# Patient Record
Sex: Female | Born: 1940 | Race: White | Hispanic: No | Smoking: Former smoker
Health system: Southern US, Community
[De-identification: ages and names within clinical notes are randomized; demographics above are authoritative.]

## PROBLEM LIST (undated history)

## (undated) DIAGNOSIS — I5043 Acute on chronic combined systolic (congestive) and diastolic (congestive) heart failure: Principal | ICD-10-CM

## (undated) DIAGNOSIS — E039 Hypothyroidism, unspecified: Secondary | ICD-10-CM

## (undated) DIAGNOSIS — C801 Malignant (primary) neoplasm, unspecified: Secondary | ICD-10-CM

## (undated) DIAGNOSIS — D649 Anemia, unspecified: Secondary | ICD-10-CM

## (undated) DIAGNOSIS — R918 Other nonspecific abnormal finding of lung field: Secondary | ICD-10-CM

## (undated) DIAGNOSIS — J449 Chronic obstructive pulmonary disease, unspecified: Secondary | ICD-10-CM

## (undated) DIAGNOSIS — E785 Hyperlipidemia, unspecified: Secondary | ICD-10-CM

## (undated) DIAGNOSIS — M810 Age-related osteoporosis without current pathological fracture: Secondary | ICD-10-CM

## (undated) DIAGNOSIS — I10 Essential (primary) hypertension: Secondary | ICD-10-CM

## (undated) DIAGNOSIS — F329 Major depressive disorder, single episode, unspecified: Secondary | ICD-10-CM

## (undated) DIAGNOSIS — F419 Anxiety disorder, unspecified: Secondary | ICD-10-CM

## (undated) DIAGNOSIS — R519 Headache, unspecified: Secondary | ICD-10-CM

## (undated) DIAGNOSIS — K219 Gastro-esophageal reflux disease without esophagitis: Secondary | ICD-10-CM

## (undated) DIAGNOSIS — I219 Acute myocardial infarction, unspecified: Secondary | ICD-10-CM

## (undated) DIAGNOSIS — F32A Depression, unspecified: Secondary | ICD-10-CM

## (undated) DIAGNOSIS — J9811 Atelectasis: Secondary | ICD-10-CM

## (undated) DIAGNOSIS — J189 Pneumonia, unspecified organism: Secondary | ICD-10-CM

## (undated) DIAGNOSIS — Z9289 Personal history of other medical treatment: Secondary | ICD-10-CM

## (undated) DIAGNOSIS — R51 Headache: Secondary | ICD-10-CM

## (undated) DIAGNOSIS — S2249XA Multiple fractures of ribs, unspecified side, initial encounter for closed fracture: Secondary | ICD-10-CM

## (undated) DIAGNOSIS — S2239XA Fracture of one rib, unspecified side, initial encounter for closed fracture: Secondary | ICD-10-CM

## (undated) DIAGNOSIS — M1990 Unspecified osteoarthritis, unspecified site: Secondary | ICD-10-CM

## (undated) HISTORY — PX: COLONOSCOPY: SHX174

## (undated) HISTORY — DX: Other nonspecific abnormal finding of lung field: R91.8

## (undated) HISTORY — DX: Atelectasis: J98.11

## (undated) HISTORY — PX: HYSTERECTOMY ABDOMINAL WITH SALPINGECTOMY: SHX6725

## (undated) HISTORY — DX: Unspecified osteoarthritis, unspecified site: M19.90

## (undated) HISTORY — DX: Multiple fractures of ribs, unspecified side, initial encounter for closed fracture: S22.49XA

## (undated) HISTORY — DX: Chronic obstructive pulmonary disease, unspecified: J44.9

## (undated) HISTORY — DX: Fracture of one rib, unspecified side, initial encounter for closed fracture: S22.39XA

## (undated) HISTORY — DX: Age-related osteoporosis without current pathological fracture: M81.0

## (undated) HISTORY — DX: Acute on chronic combined systolic (congestive) and diastolic (congestive) heart failure: I50.43

## (undated) HISTORY — DX: Essential (primary) hypertension: I10

## (undated) HISTORY — DX: Hyperlipidemia, unspecified: E78.5

---

## 1967-08-18 HISTORY — PX: EXPLORATORY LAPAROTOMY: SUR591

## 1974-08-17 HISTORY — PX: THYROIDECTOMY, PARTIAL: SHX18

## 2003-01-11 ENCOUNTER — Ambulatory Visit (HOSPITAL_BASED_OUTPATIENT_CLINIC_OR_DEPARTMENT_OTHER): Admission: RE | Admit: 2003-01-11 | Discharge: 2003-01-11 | Payer: Self-pay | Admitting: Plastic Surgery

## 2003-01-11 ENCOUNTER — Encounter (INDEPENDENT_AMBULATORY_CARE_PROVIDER_SITE_OTHER): Payer: Self-pay | Admitting: Specialist

## 2003-10-12 ENCOUNTER — Encounter (INDEPENDENT_AMBULATORY_CARE_PROVIDER_SITE_OTHER): Payer: Self-pay | Admitting: Specialist

## 2003-10-12 ENCOUNTER — Ambulatory Visit (HOSPITAL_COMMUNITY): Admission: RE | Admit: 2003-10-12 | Discharge: 2003-10-12 | Payer: Self-pay | Admitting: *Deleted

## 2005-09-01 ENCOUNTER — Other Ambulatory Visit: Admission: RE | Admit: 2005-09-01 | Discharge: 2005-09-01 | Payer: Self-pay | Admitting: Family Medicine

## 2007-08-18 HISTORY — PX: JOINT REPLACEMENT: SHX530

## 2007-10-10 ENCOUNTER — Inpatient Hospital Stay (HOSPITAL_COMMUNITY): Admission: RE | Admit: 2007-10-10 | Discharge: 2007-10-13 | Payer: Self-pay | Admitting: Orthopedic Surgery

## 2008-09-27 ENCOUNTER — Emergency Department (HOSPITAL_COMMUNITY): Admission: EM | Admit: 2008-09-27 | Discharge: 2008-09-27 | Payer: Self-pay | Admitting: Emergency Medicine

## 2010-12-30 NOTE — Op Note (Signed)
Rose Dunn, Rose Dunn             ACCOUNT NO.:  [id number]   MEDICAL RECORD NO.:  [id number]          PATIENT TYPE:  INP   LOCATION:  0005                         FACILITY:  [facility]   PHYSICIAN:  Ollen Gross, M.D.    DATE OF BIRTH:  [birth year]   DATE OF PROCEDURE:  10/10/2007  DATE OF DISCHARGE:                               OPERATIVE REPORT   PREOPERATIVE DIAGNOSIS:  Osteoarthritis right hip.   POSTOPERATIVE DIAGNOSIS:  Osteoarthritis right hip.   PROCEDURE:  Right total hip arthroplasty.   SURGEON:  Ollen Gross, M.D.   ASSISTANT:  Avel Peace PA-C   ANESTHESIA:  General.   ESTIMATED BLOOD LOSS:  300 mL.   DRAINS:  Hemovac times one.   COMPLICATIONS:  None.   CONDITION:  Stable to recovery.   BRIEF CLINICAL NOTE:  Rose Dunn is a 70 year old female with end-  stage arthritis of the right hip with progressively worsening pain and  dysfunction.  She has failed nonoperative management and presents now  for total hip arthroplasty.   PROCEDURE IN DETAIL:  After the successful administration of general  anesthetic, the patient was placed in the left lateral decubitus  position with the right side up and held with the hip positioner.  Right  lower extremity was isolated from the perineum with plastic drapes and  prepped and draped in the usual sterile fashion.  Short posterolateral  incision made with a 10 blade through subcutaneous tissue to the level  of the fascia lata which was incised in line with the skin incision.  Sciatic nerve is palpated and protected and short rotators isolated off  the femur.  Capsulectomy is performed and the hip was dislocated.  Center of the femoral head is marked and a trial prosthesis placed such  that the center of the trial head corresponds to center of her native  femoral head.  Osteotomy lines marked on the femoral neck and osteotomy  made with an oscillating saw.  Femoral head was removed and then the  femur retracted anteriorly  to gain acetabular exposure.   Acetabular retractors were placed and labrum and osteophytes removed.  Reaming starts at 45 mm coursing increments of 2 to 51 mm then a 52 mm  pinnacle acetabular shell was placed in anatomic position and transfixed  with two dome screws.  The apex hole eliminator is placed and a 36 mm  neutral Ultamet metal liner was placed.  This was a metal-on-metal hip  replacement.   The femur was prepared with the canal finder and irrigation.  Axial  reaming is performed up to 15.5 mm, proximal reaming to 20 D and the  sleeve machined to a large.  20 D large trial sleeve is placed with 20 x  15 stem and 36 +8 neck matching native anteversion.  36 +0 trial head is  placed and the hip is reduced with great stability.  There is full  extension, full external rotation, 70 degrees flexion, 40 degrees  abduction, 90 degrees internal rotation and 90 degrees of flexion, 70  degrees of internal rotation.  By placing the right leg on top  of the  left it felt as though leg lengths were equal.  The hips then dislocated  and all trials removed.  The permanent 20 D large sleeve is placed, the  20 x 15 stem and 36 +8 neck matching native anteversion.  36.0 head is  placed and the hip is reduced with same stability parameters.  Wound was  copiously irrigated with saline solution and short rotators reattached  to the femur through drill holes.  Fascia lata was closed over Hemovac  drain with interrupted #1-0 Vicryl.  Subcu closed with #1-0 and #2-0  Vicryl and subcuticular running 4-0 Monocryl.  Drains hooked to suction.  Incision cleaned and dried and Steri-Strips and bulky sterile dressing  applied.  She is then placed into a knee immobilizer, awakened and  transported to recovery in stable condition.      Ollen Gross, M.D.  Electronically Signed     FA/MEDQ  D:  10/10/2007  T:  10/10/2007  Job:  [id number]

## 2010-12-30 NOTE — H&P (Signed)
Rose Dunn, Rose Dunn             ACCOUNT NO.:  [id number]   MEDICAL RECORD NO.:  [id number]          PATIENT TYPE:  INP   LOCATION:  0005                         FACILITY:  [facility]   PHYSICIAN:  Ollen Gross, M.D.    DATE OF BIRTH:  [birth year]   DATE OF ADMISSION:  10/10/2007  DATE OF DISCHARGE:                              HISTORY & PHYSICAL   CHIEF COMPLAINT:  Right hip pain.   HISTORY OF PRESENT ILLNESS:  The patient is a 70 year old female who has  been seen by Dr. Lequita Halt in second opinion.  She has had progressive hip  pain for quite some time now that has been ongoing for over a year and  half.  She has been on Mobic and various medications including Vicodin.  She has significant limitations.  She is seen in the office and found to  have contacting bone-on-bone arthritis in the right hip.  It has been  progressive in nature and it was felt she would benefit from undergoing  surgical intervention.  Risks and benefits have been discussed and she  elected to proceed with surgery.   ALLERGIES:  No known drug allergies.  DARVOCET makes her feel ill.   PAST MEDICAL HISTORY:  1. Osteoarthritis.  2. Migraines.  3. Anxiety.  4. Depression.  5. Hypertension.  6. Reflux disease.   PAST SURGICAL HISTORY:  1. Subtotal thyroidectomy.  2. Tubal ectopic pregnancy.  3. Hysterectomy.   SOCIAL HISTORY:  Divorced.  She works as a Psychologist, forensic.  Nonsmoker.  Two to three drinks of alcohol per day.  Two children.  Daughter will be  assisting with care after surgery.   FAMILY HISTORY:  Father with history of heart disease, hypertension and  angina.  Mother with arthritis and Alzheimer's.   REVIEW OF SYSTEMS:  GENERAL:  No fevers, chills or night sweats.  NEUROLOGIC:  No seizures or paralysis.  RESPIRATORY:  No shortness of  breath, productive cough or hemoptysis.  CARDIOVASCULAR:  No chest pain  or angina.  GI:  No nausea, diarrhea or constipation.  GU:  No dysuria,  hematuria or  dysuria.  MUSCULOSKELETAL:  Right hip.   CURRENT MEDICATIONS:  Atenolol, lisinopril, Synthroid, Mobic, Evista,  pantoprazole, Lipitor, Cymbalta, hydrocodone, Ativan and hydrocortisone  cream.   PHYSICAL EXAMINATION:  VITAL SIGNS:  Pulse 68, respirations 14, blood  pressure 148/72.  GENERAL:  A 70 year old, white female, well-nourished, well-developed,  slightly overweight in no acute distress.  She is alert and oriented,  cooperative and good historian.  HEENT:  Normocephalic, atraumatic.  Pupils round and reactive.  Oropharynx clear.  EOMs intact.  NECK:  Supple.  CHEST:  Clear to anterior and posterior chest walls.  HEART:  Regular rate and rhythm.  No murmurs.  ABDOMEN:  Soft, nontender.  Bowel sounds present.  RECTAL/BREASTS/GENITALIA:  Not done, not pertinent to present illness.  EXTREMITIES:  Right hip flexion 90, 0 internal rotation, 5 degrees  external rotation, 10 degrees abduction, antalgic gait.   IMPRESSION:  1. Osteoarthritis, right hip.  2. Migraines.  3. Anxiety.  4. Depression.  5. Hypertension.  6. Reflux disease.  PLAN:  The patient will be admitted to [facility] to undergo a  right total hip replacement arthroplasty.  She has been seen  preoperative by Dr. Laurann Montana and felt to be stable for up and  coming surgery.      Alexzandrew L. Perkins, P.A.C.      Ollen Gross, M.D.  Electronically Signed    ALP/MEDQ  D:  10/09/2007  T:  10/10/2007  Job:  [id number]   cc:   Stacie Acres. Cliffton Asters, M.D.  Fax: [phone]

## 2011-01-02 NOTE — Discharge Summary (Signed)
Rose Dunn, Rose Dunn             ACCOUNT NO.:  [id number]   MEDICAL RECORD NO.:  [id number]          PATIENT TYPE:  INP   LOCATION:  1603                         FACILITY:  [facility]   PHYSICIAN:  Ollen Gross, M.D.    DATE OF BIRTH:  09-Feb-1941   DATE OF ADMISSION:  10/10/2007  DATE OF DISCHARGE:  10/13/2007                               DISCHARGE SUMMARY   ADMITTING DIAGNOSES:  1. Osteoarthritis right hip.  2. Migraines.  3. Anxiety.  4. Depression.  5. Hypertension.  6. Reflux disease.   DISCHARGE DIAGNOSES:  1. Osteoarthritis right hip status post right total hip arthroplasty.  2. Postoperative blood loss anemia, did not require transfusion.  3. Migraines.  4. Anxiety.  5. Depression.  6. Hypertension.  7. Reflux disease.   PROCEDURE:  October 10, 2007   SURGEON:  Dr. Lequita Halt.   ASSISTANT:  Alexzandrew L. Perkins, P.A.C.   CONSULTATIONS:  None.   BRIEF HISTORY:  [name] is a 70 year old female with end-stage  osteoarthritis of right hip __________ now presents for total hip  arthroplasty.   LABORATORY DATA:  Preop:  CBC showed 13.5 _hemoglobin.  serial CBCs were  followed with hemoglobin down to 9.7 to 9.3 , INR 1.9, .  CHEM panel on  admission BUN of 24, .  BUN came down to 9.  Preop UA:  Small leukocyte  esterase,_.  Right hip x-ray  September 30, 2007, no acute fracture or  subluxation, extensive osteoarthritic changes.  Two-view chest September 30, 2007:  No acute infiltrate or pleural effusions, right perihilar  atelectasis.   HOSPITAL COURSE:  Patient admitted to [facility], tolerated  postoperative course well, lateral transferred to the recovery room and  the orthopedic floor.  Started on PCA and p.o. analgesia for pain  control following surgery.  Doing pretty well on the morning of day 1,  started on Coumadin for DVT prophylaxis.  Given 24 hours postop IV  antibiotics.  Hemoglobin was down to 9.7.  She was asymptomatic with  this.   Started on iron supplementation.  Discontinued the PCA and the  knee immobilizer.  Had low pressure, so we held lisinopril with  __________ parameter.  Started back on her other home meds.  Starting  getting out of bed with therapy by day 2.  Blood pressure has improved a  little bit.  Had excellent urinary output.  Hemoglobin is down a little  bit further at 9.3, but she was still asymptotic with that.  Dressing  change incision looks good.  From a therapy standpoint, she is up  walking about 125 feet.  Continue to progress well by February 26 ready  for discharge   __________   DIET:  Heart healthy diet.   Partial weightbearing right lower extremity.  Hip precautions __________  Follow up 2 weeks.   DISPOSITION:  __________      Alexzandrew L. Perkins, P.A.C.      Ollen Gross, M.D.  Electronically Signed    ALP/MEDQ  D:  11/21/2007  T:  11/21/2007  Job:  [id number]   cc:   Ollen Gross,  M.D.  Fax: [phone]   Stacie Acres. Cliffton Asters, M.D.  Fax: [phone]

## 2011-01-02 NOTE — Op Note (Signed)
   NAMECERENITI, CURB                         ACCOUNT NO.:  [id number]   MEDICAL RECORD NO.:  [id number]                   PATIENT TYPE:  AMB   LOCATION:  DSC                                  FACILITY:  [facility]   PHYSICIAN:  Consuello Bossier., M.D.         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   SURGEON:  Pleas Patricia, M.D.   ANESTHESIA:  Xylocaine 2% with epinephrine 1:100,000.   FINDINGS:  The patient had the above lesions which were treated by wide  local excisional biopsy and primary closure.   DESCRIPTION OF PROCEDURE:  The patient was brought to the operating room and  marked off with a planned elliptical excision in the wrinkle lines.  She was  prepped with Betadine and draped sterilely.  She was anesthetized with  Xylocaine 2% with epinephrine 1:100,000.  The excisional biopsies were  performed.  At this point, the wound was inspected and the resulting length  closures as noted above was accomplished with interrupted running #6-0  Prolene.  Neosporin ointment and light compressive dressings were applied.  The patient tolerated the procedure well and  will be discharged from the  operating room and subsequently be followed by me as an outpatient next week  for suture removal.                                               Consuello Bossier., M.D.    HH/MEDQ  D:  01/12/2003  T:  01/13/2003  Job:  [id number]

## 2011-05-08 LAB — BASIC METABOLIC PANEL
CO2: 26
Calcium: 8.2 — ABNORMAL LOW
Chloride: 107
GFR calc Af Amer: >60
GFR calc Af Amer: >60
GFR calc non Af Amer: >60
GFR calc non Af Amer: >60
Glucose, Bld: 119 — ABNORMAL HIGH
Potassium: 3.9
Potassium: 4.1
Sodium: 136
Sodium: 140

## 2011-05-08 LAB — CBC
HCT: 38
HCT: 26 — ABNORMAL LOW
HCT: 26.5 — ABNORMAL LOW
HCT: 27.6 — ABNORMAL LOW
Hemoglobin: 9 — ABNORMAL LOW
Hemoglobin: 9.3 — ABNORMAL LOW
MCHC: 35.6
MCHC: 34.9
MCV: 94
MCV: 94.3
Platelets: 308
Platelets: 247
RBC: 2.81 — ABNORMAL LOW
RBC: 2.94 — ABNORMAL LOW
RDW: 13.2
RDW: 13.4
WBC: 8.3
WBC: 8.9

## 2011-05-08 LAB — URINALYSIS, ROUTINE W REFLEX MICROSCOPIC
Hgb urine dipstick: NEGATIVE
Nitrite: NEGATIVE
Protein, ur: NEGATIVE
Specific Gravity, Urine: 1.025
Urobilinogen, UA: 0.2

## 2011-05-08 LAB — COMPREHENSIVE METABOLIC PANEL
Albumin: 3.9
Alkaline Phosphatase: 81
BUN: 24 — ABNORMAL HIGH
Calcium: 9.6
Potassium: 5
Sodium: 141
Total Protein: 7.1

## 2011-05-08 LAB — URINE MICROSCOPIC-ADD ON

## 2011-05-08 LAB — TYPE AND SCREEN

## 2011-05-08 LAB — PROTIME-INR
INR: 0.9
INR: 1.3
Prothrombin Time: 12.2

## 2011-05-08 LAB — ABO/RH: ABO/RH(D): O POS

## 2012-02-22 ENCOUNTER — Other Ambulatory Visit: Payer: Self-pay | Admitting: Family Medicine

## 2012-02-22 ENCOUNTER — Ambulatory Visit
Admission: RE | Admit: 2012-02-22 | Discharge: 2012-02-22 | Disposition: A | Discharge status: Home / Self Care | Payer: BC Managed Care – PPO | Source: Ambulatory Visit | Attending: Family Medicine | Admitting: Family Medicine

## 2012-02-22 DIAGNOSIS — M25559 Pain in unspecified hip: Secondary | ICD-10-CM

## 2013-11-14 ENCOUNTER — Other Ambulatory Visit: Payer: Self-pay | Admitting: Family Medicine

## 2013-11-14 DIAGNOSIS — M5410 Radiculopathy, site unspecified: Secondary | ICD-10-CM

## 2013-11-14 DIAGNOSIS — M545 Low back pain, unspecified: Secondary | ICD-10-CM

## 2013-11-16 ENCOUNTER — Ambulatory Visit
Admission: RE | Admit: 2013-11-16 | Discharge: 2013-11-16 | Disposition: A | Discharge status: Home / Self Care | Payer: BC Managed Care – PPO | Source: Ambulatory Visit | Attending: Family Medicine | Admitting: Family Medicine

## 2013-11-16 DIAGNOSIS — M545 Low back pain, unspecified: Secondary | ICD-10-CM

## 2013-11-16 DIAGNOSIS — M5410 Radiculopathy, site unspecified: Secondary | ICD-10-CM

## 2015-08-18 DIAGNOSIS — J189 Pneumonia, unspecified organism: Secondary | ICD-10-CM

## 2015-08-18 HISTORY — DX: Pneumonia, unspecified organism: J18.9

## 2015-08-18 HISTORY — PX: EYE SURGERY: SHX253

## 2015-10-31 ENCOUNTER — Other Ambulatory Visit (HOSPITAL_COMMUNITY): Payer: Self-pay | Admitting: Psychiatry

## 2015-12-19 ENCOUNTER — Other Ambulatory Visit (HOSPITAL_COMMUNITY): Payer: Self-pay | Admitting: Psychiatry

## 2016-01-30 ENCOUNTER — Ambulatory Visit
Admission: RE | Admit: 2016-01-30 | Discharge: 2016-01-30 | Disposition: A | Discharge status: Home / Self Care | Payer: Medicare Other | Source: Ambulatory Visit | Attending: Family Medicine | Admitting: Family Medicine

## 2016-01-30 ENCOUNTER — Other Ambulatory Visit: Payer: Self-pay | Admitting: Family Medicine

## 2016-01-30 DIAGNOSIS — R0781 Pleurodynia: Secondary | ICD-10-CM

## 2016-01-30 DIAGNOSIS — R9389 Abnormal findings on diagnostic imaging of other specified body structures: Secondary | ICD-10-CM

## 2016-01-31 ENCOUNTER — Telehealth: Payer: Self-pay | Admitting: Pulmonary Disease

## 2016-01-31 NOTE — Telephone Encounter (Signed)
Spoke with Rose Dunn at Dr. Orest Dikes office with [facility]. Advised her that we do not any available consult appointments for today. Rose Dunn is going to talk to Dr. Dema Severin and call us back. Will await her call.

## 2016-02-03 NOTE — Telephone Encounter (Signed)
lmtcb X1 for [employer] at [place].

## 2016-02-05 NOTE — Telephone Encounter (Signed)
Spoke with Tito Dine at Dr. Orest Dikes office. States that she spoke with Dr. Dema Severin and they referred pt to [facility]. Nothing further was needed.

## 2016-02-07 ENCOUNTER — Other Ambulatory Visit: Payer: No Typology Code available for payment source

## 2016-02-19 ENCOUNTER — Other Ambulatory Visit (HOSPITAL_COMMUNITY): Payer: Self-pay | Admitting: Pulmonary Disease

## 2016-02-19 DIAGNOSIS — R918 Other nonspecific abnormal finding of lung field: Secondary | ICD-10-CM

## 2016-03-03 ENCOUNTER — Ambulatory Visit (HOSPITAL_COMMUNITY)
Admission: RE | Admit: 2016-03-03 | Discharge: 2016-03-03 | Disposition: A | Discharge status: Home / Self Care | Payer: Medicare Other | Source: Ambulatory Visit | Attending: Pulmonary Disease | Admitting: Pulmonary Disease

## 2016-03-03 DIAGNOSIS — R918 Other nonspecific abnormal finding of lung field: Secondary | ICD-10-CM | POA: Insufficient documentation

## 2016-03-03 LAB — GLUCOSE, CAPILLARY: Glucose-Capillary: 107 mg/dL — ABNORMAL HIGH (ref 65–99)

## 2016-03-03 MED ORDER — FLUDEOXYGLUCOSE F - 18 (FDG) INJECTION
8.8000 | Freq: Once | INTRAVENOUS | Status: AC | PRN
  Administered 2016-03-03: 8.8 via INTRAVENOUS

## 2016-03-11 ENCOUNTER — Encounter: Payer: Self-pay | Admitting: Cardiothoracic Surgery

## 2016-03-11 ENCOUNTER — Other Ambulatory Visit: Payer: Self-pay | Admitting: *Deleted

## 2016-03-11 ENCOUNTER — Telehealth: Payer: Self-pay | Admitting: *Deleted

## 2016-03-11 ENCOUNTER — Encounter: Payer: Self-pay | Admitting: *Deleted

## 2016-03-11 ENCOUNTER — Institutional Professional Consult (permissible substitution) (INDEPENDENT_AMBULATORY_CARE_PROVIDER_SITE_OTHER): Payer: Medicare Other | Admitting: Cardiothoracic Surgery

## 2016-03-11 DIAGNOSIS — J449 Chronic obstructive pulmonary disease, unspecified: Secondary | ICD-10-CM | POA: Insufficient documentation

## 2016-03-11 DIAGNOSIS — J9811 Atelectasis: Secondary | ICD-10-CM

## 2016-03-11 DIAGNOSIS — R918 Other nonspecific abnormal finding of lung field: Secondary | ICD-10-CM | POA: Diagnosis not present

## 2016-03-11 NOTE — Telephone Encounter (Signed)
Oncology Nurse Navigator Documentation  Oncology Nurse Navigator Flowsheets 03/11/2016  Navigator Encounter Type Telephone  Telephone Outgoing Call  Treatment Phase Pre-Tx/Tx Discussion  Barriers/Navigation Needs Coordination of Care  Interventions Coordination of Care  Coordination of Care Appts  Acuity Level 1  Acuity Level 1 Initial guidance, education and coordination as needed  Time Spent with Patient 15    I received referral on Rose Dunn today.  I called and spoke with her.  I gave her an appt to see Dr. Julien Nordmann on 03/25/16 arrive at 1:45 with labs at 2:00 and see Dr. Julien Nordmann at 2:15.  She verbalized understanding of appt time and place.

## 2016-03-11 NOTE — Progress Notes (Signed)
PCP is Vidal Schwalbe, MD Referring Provider is Loletha Carrow, MD  Chief Complaint  Patient presents with  . Lung Mass    Surgical eval, PET Scan 03/03/2016, CT CHEST 01/30/16  Patient examined, CT scan of chest June 2017 and PET scan July 2017 and PFTs from July 2017 all personally reviewed and counseled with patient.  HPI: Very nice active 75 year old reformed smoker presents for evaluation of recently diagnosed right upper lobe 2 cm mass in the anterior segment with postobstructive atelectasis associated with a 1.6 cm right paratracheal node just above the carina. Both have mild metabolic activity on PET scan--the right upper lobe mass SUV 4.0 and the right precarinal lymph node 3.0 SUV. There are no other hypermetabolic areas of concern on PET scan.  The right upper lobe abnormality was an initial finding when the patient fell on her right side at her apartment complex and sustained blunt injury and nondisplaced rib fractures. These have now healed and she is asymptomatic.  The patient stopped 15 years ago. There is possible family history of lung cancer in her grandmother. The patient does have some intermittent episodes of bronchitis and productive cough. Currently she is clear.  Patient underwent evaluation by Dr. Welford Roche at [facility]. Bronchoscopy was performed which apparently was stopped when there was some bleeding from the right upper lobe endoluminal mass with visible blood vessels suggestive of carcinoid tumor. Nondiagnostic material was obtained. Pulmonary function testing completed showed FEV1 2.08, FVC 2.5 and diffusion capacity 62% predicted.  Dr. Welford Roche did not feel that further biopsy was needed and the patient presents for thoracic surgical evaluation and resection.  The patient's functional status is good. She still works part-time as a Herbalist. She works out at [employer] 3 days a week including at least 15 minutes of aerobic red female activity and another  15-20 minutes of weight machines. She does not have significant dyspnea on exertion. I walked the patient 200 feet in the office and her oxygen saturation at rest 97% did not drop.  The patient has had general anesthesia and right total hip replacement in 2009 without anesthetic or pulmonary or cardiac complication. Patient is status post subtotal thyroidectomy for hyperparathyroidism  Patient has history of hypertension, hyperlipidemia, smoking, and family history of CAD. She has not had a stress test in several years. Past Medical History:  Diagnosis Date  . Atelectasis of right lung   . COPD (chronic obstructive pulmonary disease) ([place])   . Lung mass   . Rib fractures     No past surgical history on file.  No family history on file.  Social History Social History  Substance Use Topics  . Smoking status: Former Smoker    Packs/day: 1.00    Years: 14.00    Types: Cigarettes    Quit date: 03/12/1999  . Smokeless tobacco: Never Used  . Alcohol use Yes     Comment: 1-2 DRINKS/DAY    Current Outpatient Prescriptions  Medication Sig Dispense Refill  . Ascorbic Acid (VITAMIN C) 1000 MG tablet Take 1,000 mg by mouth daily.    Marland Kitchen ascorbic acid (VITAMIN C) 500 MG tablet Take 500 mg by mouth daily.    Marland Kitchen aspirin EC 81 MG tablet Take 81 mg by mouth daily.    Marland Kitchen atorvastatin (LIPITOR) 80 MG tablet Take 80 mg by mouth daily.    Marland Kitchen BIOTIN 5000 PO Take 1 tablet by mouth daily.    . Calcium Carbonate-Vitamin D (CALCIUM 600+D) 600-200 MG-UNIT  TABS Take 1 tablet by mouth 2 (two) times daily.    . ergocalciferol (VITAMIN D2) 50000 units capsule Take 50,000 Units by mouth once a week.    Marland Kitchen FOLIC ACID PO Take [id number] mcg by mouth.    . hydrochlorothiazide (HYDRODIURIL) 25 MG tablet Take 25 mg by mouth daily.    Marland Kitchen levothyroxine (SYNTHROID, LEVOTHROID) 125 MCG tablet Take 125 mcg by mouth daily before breakfast.    . LORazepam (ATIVAN) 0.5 MG tablet Take 0.5 mg by mouth as needed for anxiety.    .  Multiple Vitamins-Minerals (MULTIVITAMIN ADULT PO) Take 1 tablet by mouth daily.    . nebivolol (BYSTOLIC) 5 MG tablet Take 5 mg by mouth daily.    . pantoprazole (PROTONIX) 40 MG tablet Take 40 mg by mouth 2 (two) times daily.    . Probiotic Product (SOLUBLE FIBER/PROBIOTICS PO) Take 1 tablet by mouth daily. 30 billion=dosage    . raloxifene (EVISTA) 60 MG tablet Take 60 mg by mouth daily.     No current facility-administered medications for this visit.     Allergies  Allergen Reactions  . Amoxicillin Anaphylaxis  . Penicillins Anaphylaxis  . Lisinopril Other (See Comments)  . Metoprolol Succinate Er Other (See Comments)    Review of Systems         Review of Systems :  [ y ] = yes, [  ] = no        General :  Weight gain [   ]    Weight loss  [   ]  Fatigue [  ]  Fever [  ]  Chills  [  ]                                Weakness  [  ]           HEENT    Headache [  ]  Dizziness [  ]  Blurred vision Blue.Reese  ] Glaucoma  [  ]                          Nosebleeds [  ] Painful or loose teeth [  ]has had cataract surgery        Cardiac :  Chest pain/ pressure [  ]  Resting SOB [  ] exertional SOB [  ]                        Orthopnea [  ]  Pedal edema  [  ]  Palpitations [  ] Syncope/presyncope [ ]                         Paroxysmal nocturnal dyspnea [  ]         Pulmonary : cough Blue.Reese  ]  wheezing [  ]  Hemoptysis [  ] Sputum [  ] Snoring [  ]                              Pneumothorax [  ]  Sleep apnea [  ]        GI : Vomiting [  ]  Dysphagia [  ]  Melena  [  ]  Abdominal pain [  ] BRBPR [  ]  Heart burn [  ]  Constipation [  ] Diarrhea  [  ] Colonoscopy [   ]        GU : Hematuria [  ]  Dysuria [  ]  Nocturia [  ] UTI's [  ]        Vascular : Claudication [  ]  Rest pain [  ]  DVT [  ] Vein stripping [  ] leg ulcers [  ]                          TIA [  ] Stroke [  ]  Varicose veins [  ]        NEURO :  Headaches  [  ] Seizures [  ] Vision changes [ y ] Paresthesias [  ]                                        Seizures [  ]        Musculoskeletal :  Arthritis [  ] Gout  [  ]  Back pain [  ]  Joint pain [ y low back pain with history of epidural steroid injections ]        Skin :  Rash [  ]  Melanoma [  ] Sores [  ]        Heme : Bleeding problems [  ]Clotting Disorders [  ] Anemia [  ]Blood Transfusion [ ]         Endocrine : Diabetes [  ] Heat or Cold intolerance [  ] Polyuria [  ]excessive thirst [ ]         Psych : Depression [  ]  Anxiety [  ]  Psych hospitalizations [  ] Memory change [ mild ]                                               BP 122/81   Pulse 94   Resp 16   Ht 6\' 11"  (2.108 m)   Wt 175 lb (79.4 kg)   SpO2 96% Comment: ON RA  BMI 17.86 kg/m  Physical Exam      Physical Exam  General: Very pleasant and intelligent female no acute distress HEENT: Normocephalic pupils equal , dentition adequate Neck: Supple without JVD, adenopathy, or bruit. Old thyroidectomy scar. Chest: Clear to auscultation, symmetrical breath sounds, no rhonchi, no tenderness             or deformity Cardiovascular: Regular rate and rhythm, no murmur, no gallop, peripheral pulses             palpable in all extremities Abdomen:  Soft, nontender, no palpable mass or organomegaly Extremities: Warm, well-perfused, no clubbing cyanosis edema or tenderness,              no venous stasis changes of the legs Rectal/GU: Deferred Neuro: Grossly non--focal and symmetrical throughout Skin: Clean and dry without rash or ulceration   Diagnostic Tests: Results of chest CT scan and PET scan has noted above. 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 There is a 1.6 cm right pretracheal lymph node as well. This has very mild activity and PET scan. This lymph node would have low yield as biopsy through mediastinoscopy but could be accessed with E BUS. No other evidence of metastatic involvement. PFTs are adequate for major pulmonary resection-lobectomy if needed. Repeat bronchoscopy and biopsy of the primary tumor would be an option but based on findings of Dr. Welford Roche this would probably be low yield as well.  I recommended to the patient the plan for bronchoscopic examination of the tumor and simultaneous EBUS of the pre-carinal lymph node. If the quick prep cytology of the lymph node is negative then right VATS and right upper lobectomy.  The patient is interested in other ways to treat potential lung cancer-she wishes  to be assessed by Dr. Julien Nordmann which I totally support. This will be arranged.    Plan: Proceed with brain MRI, cardiac clearance for possible VATS and pulmonary section and oncology assessment by Dr. Julien Nordmann.   Len Childs, MD [facility] [phone]

## 2016-03-12 LAB — PULMONARY FUNCTION TEST

## 2016-03-13 ENCOUNTER — Telehealth: Payer: Self-pay | Admitting: Cardiovascular Disease

## 2016-03-13 NOTE — Telephone Encounter (Signed)
Pt is set up for new patient appt on Aug 25th w Dr. Oval Linsey for cardiac clearance for possible VATS procedure and pulmonary resection, orders for stress test, etc. Call today is from Dr. Lucianne Lei Trigt's office requesting patient be seen sooner than the scheduled appt.  Will route to physician and scheduler for options. If she is new patient, can she see 1st available provider? OK to set up w/ APP?

## 2016-03-13 NOTE — Telephone Encounter (Signed)
New message    Pt needs an earlier appt than Aug 25 per Dr. Prescott Gum. Please call.

## 2016-03-13 NOTE — Telephone Encounter (Signed)
She can be seen first available.  APP is OK.

## 2016-03-17 NOTE — Telephone Encounter (Signed)
Routed to scheduling to help - see note - she has f/u w Prescott Gum on aug 16th and needs cardiac clearance prior to appt. Can be seen by PA per Dr. Oval Linsey.

## 2016-03-17 NOTE — Telephone Encounter (Signed)
Follow-up      The pt needs to be seen before August 16 th

## 2016-03-20 ENCOUNTER — Ambulatory Visit
Admission: RE | Admit: 2016-03-20 | Discharge: 2016-03-20 | Disposition: A | Discharge status: Home / Self Care | Payer: Medicare Other | Source: Ambulatory Visit | Attending: Cardiothoracic Surgery | Admitting: Cardiothoracic Surgery

## 2016-03-20 DIAGNOSIS — R918 Other nonspecific abnormal finding of lung field: Secondary | ICD-10-CM

## 2016-03-20 MED ORDER — GADOBENATE DIMEGLUMINE 529 MG/ML IV SOLN
16.0000 mL | Freq: Once | INTRAVENOUS | Status: AC | PRN
  Administered 2016-03-20: 16 mL via INTRAVENOUS

## 2016-03-25 ENCOUNTER — Encounter: Payer: Self-pay | Admitting: Internal Medicine

## 2016-03-25 ENCOUNTER — Other Ambulatory Visit (HOSPITAL_BASED_OUTPATIENT_CLINIC_OR_DEPARTMENT_OTHER): Payer: Medicare Other

## 2016-03-25 ENCOUNTER — Ambulatory Visit (HOSPITAL_BASED_OUTPATIENT_CLINIC_OR_DEPARTMENT_OTHER): Payer: Medicare Other | Admitting: Internal Medicine

## 2016-03-25 VITALS — BP 126/45 | HR 82 | Temp 98.2°F | Resp 17 | Ht >= 80 in | Wt 179.8 lb

## 2016-03-25 DIAGNOSIS — R918 Other nonspecific abnormal finding of lung field: Secondary | ICD-10-CM | POA: Diagnosis not present

## 2016-03-25 LAB — CBC WITH DIFFERENTIAL/PLATELET
BASO%: 1 % (ref 0.0–2.0)
BASOS ABS: 0.1 10*3/uL (ref 0.0–0.1)
EOS%: 3 % (ref 0.0–7.0)
Eosinophils Absolute: 0.2 10*3/uL (ref 0.0–0.5)
HCT: 38 % (ref 34.8–46.6)
HEMOGLOBIN: 12.8 g/dL (ref 11.6–15.9)
LYMPH%: 31.8 % (ref 14.0–49.7)
MCH: 32 pg (ref 25.1–34.0)
MCHC: 33.7 g/dL (ref 31.5–36.0)
MCV: 95.1 fL (ref 79.5–101.0)
MONO#: 0.5 10*3/uL (ref 0.1–0.9)
MONO%: 7.2 % (ref 0.0–14.0)
NEUT%: 57 % (ref 38.4–76.8)
NEUTROS ABS: 4.2 10*3/uL (ref 1.5–6.5)
Platelets: 235 10*3/uL (ref 145–400)
RBC: 3.99 10*6/uL (ref 3.70–5.45)
RDW: 13 % (ref 11.2–14.5)
WBC: 7.4 10*3/uL (ref 3.9–10.3)
lymph#: 2.4 10*3/uL (ref 0.9–3.3)

## 2016-03-25 LAB — COMPREHENSIVE METABOLIC PANEL
ALT: 22 U/L (ref 0–55)
AST: 25 U/L (ref 5–34)
Albumin: 3.8 g/dL (ref 3.5–5.0)
Alkaline Phosphatase: 71 U/L (ref 40–150)
Anion Gap: 10 mEq/L (ref 3–11)
BUN: 20.4 mg/dL (ref 7.0–26.0)
CALCIUM: 9.4 mg/dL (ref 8.4–10.4)
CHLORIDE: 106 meq/L (ref 98–109)
CO2: 25 meq/L (ref 22–29)
CREATININE: 0.8 mg/dL (ref 0.6–1.1)
EGFR: 69 mL/min/{1.73_m2} — AB (ref >90)
Glucose: 110 mg/dl (ref 70–140)
POTASSIUM: 3.9 meq/L (ref 3.5–5.1)
Sodium: 141 mEq/L (ref 136–145)
Total Bilirubin: 0.37 mg/dL (ref 0.20–1.20)
Total Protein: 6.9 g/dL (ref 6.4–8.3)

## 2016-03-25 NOTE — Progress Notes (Signed)
[place] [phone]   [phone]  CONSULT NOTE  REFERRING PHYSICIAN: Dr. Tharon Aquas Trigt  REASON FOR CONSULTATION:  75 years old white female with questionable lung cancer.  HPI Rose Dunn is a 75 y.o. female was past medical history significant for COPD, hypercholesterolemia, cataract surgery, right hip replacement as well as smoking for around 12 years but quit in 2000. The patient mentioned that 2 months ago she fell in her apartment on the right side of her body and had blunt trauma at that time. She had CT scan of the chest without contrast on 01/30/2016 and it showed linear opacity within the right upper lobe likely representing collapse of the anterior segment of the right upper lobe with apparent interruption of its segmental bronchus proximally. Correlation with bronchoscopy to exclude obstructing endobronchial lesion was recommended. The patient underwent bronchoscopy under the care of Dr. Welford Roche at [facility] but unfortunately she had significant bleeding during the procedure and it was aborted. A PET scan performed on 03/03/2016 showed hypermetabolic and do bronchial/right perihilar lesion with abrupt cut off of right upper lobe bronchus. This appearance raises concern for primary bronchogenic neoplasm. An endobronchial lesion such as carcinoid remains within the differential. There was also hypermetabolic 1.0 cm short axis low right paratracheal node, metastasis is not excluded. There was also healing anterior rib fractures bilaterally. The patient was seen by Dr. Prescott Gum and he recommended for her bronchoscopy with endobronchial ultrasound and biopsy of the precarinal lymph node plus/minus right VATS with right upper lobectomy depending on the cytology of the lymph node. The patient requested a discussion with the medical oncologist and Dr. Prescott Gum kindly referred her to me today for evaluation of her condition. He ordered MRI  of the brain which was performed on 03/20/2016 and showed no evidence of metastatic disease to the brain. When seen today the patient is feeling fine with no specific complaints except for mild productive cough. She denied having any significant chest pain, shortness breath or hemoptysis. She denied having any significant weight loss or night sweats. The patient has no nausea, vomiting, diarrhea or constipation. Family history significant for mother died from 1 [place], father had prostate cancer, maternal grandmother had lung cancer. The patient is divorced and has 2 children is son and daughter. She works as Statistician. She has short period of smoking for around 12 years but quit in 2000. She also drinks 1-2 alcoholic drinks every night. She has no history of drug abuse.  HPI  Past Medical History:  Diagnosis Date  . Atelectasis of right lung   . COPD (chronic obstructive pulmonary disease) ([place])   . Lung mass   . Rib fractures     History reviewed. No pertinent surgical history.  History reviewed. No pertinent family history.  Social History Social History  Substance Use Topics  . Smoking status: Former Smoker    Packs/day: 1.00    Years: 14.00    Types: Cigarettes    Quit date: 03/12/1999  . Smokeless tobacco: Never Used  . Alcohol use Yes     Comment: 1-2 DRINKS/DAY    Allergies  Allergen Reactions  . Amoxicillin Anaphylaxis  . Penicillins Anaphylaxis  . Vortioxetine Swelling    Worse depression, achy, edema  . Lisinopril Other (See Comments)  . Metoprolol Succinate Er Other (See Comments)    Current Outpatient Prescriptions  Medication Sig Dispense Refill  . Ascorbic Acid (VITAMIN C) 1000 MG tablet  Take 1,000 mg by mouth daily.    Marland Kitchen aspirin EC 81 MG tablet Take 81 mg by mouth daily.    Marland Kitchen atorvastatin (LIPITOR) 80 MG tablet Take 80 mg by mouth daily.    Marland Kitchen BIOTIN 5000 PO Take 1 tablet by mouth daily.    . Calcium Carbonate-Vitamin D (CALCIUM 600+D) 600-200  MG-UNIT TABS Take 1 tablet by mouth 2 (two) times daily.    Marland Kitchen desvenlafaxine (PRISTIQ) 100 MG 24 hr tablet Take 100 mg by mouth daily.    Marland Kitchen FOLIC ACID PO Take [id number] mcg by mouth.    . hydrochlorothiazide (HYDRODIURIL) 25 MG tablet Take 25 mg by mouth daily.    Marland Kitchen levothyroxine (SYNTHROID, LEVOTHROID) 125 MCG tablet Take 125 mcg by mouth daily before breakfast.    . LORazepam (ATIVAN) 0.5 MG tablet Take 0.5 mg by mouth as needed for anxiety.    . Multiple Vitamins-Minerals (MULTIVITAMIN ADULT PO) Take 1 tablet by mouth daily.    . nebivolol (BYSTOLIC) 5 MG tablet Take 5 mg by mouth daily.    . pantoprazole (PROTONIX) 40 MG tablet Take 40 mg by mouth 2 (two) times daily.    . Probiotic Product (SOLUBLE FIBER/PROBIOTICS PO) Take 1 tablet by mouth daily. 30 billion=dosage    . raloxifene (EVISTA) 60 MG tablet Take 60 mg by mouth daily.    . SUPER B COMPLEX/C PO Take by mouth.     No current facility-administered medications for this visit.     Review of Systems  Constitutional: negative Eyes: negative Ears, nose, mouth, throat, and face: negative Respiratory: positive for cough and sputum Cardiovascular: negative Gastrointestinal: negative Genitourinary:negative Integument/breast: negative Hematologic/lymphatic: negative Musculoskeletal:negative Neurological: negative Behavioral/Psych: negative Endocrine: negative Allergic/Immunologic: negative  Physical Exam  [id number], healthy, no distress, well nourished and well developed SKIN: skin color, texture, turgor are normal, no rashes or significant lesions HEAD: Normocephalic, No masses, lesions, tenderness or abnormalities EYES: normal, PERRLA, Conjunctiva are pink and non-injected EARS: External ears normal, Canals clear OROPHARYNX:no exudate, no erythema and lips, buccal mucosa, and tongue normal  NECK: supple, no adenopathy, no JVD LYMPH:  no palpable lymphadenopathy, no hepatosplenomegaly BREAST:not examined LUNGS: clear to  auscultation , and palpation HEART: regular rate & rhythm, no murmurs and no gallops ABDOMEN:abdomen soft, non-tender, obese, normal bowel sounds and no masses or organomegaly BACK: Back symmetric, no curvature., No CVA tenderness EXTREMITIES:no joint deformities, effusion, or inflammation, no edema, no skin discoloration  NEURO: alert & oriented x 3 with fluent speech, no focal motor/sensory deficits  PERFORMANCE STATUS: ECOG 1  LABORATORY DATA: Lab Results  Component Value Date   WBC 7.4 03/25/2016   HGB 12.8 03/25/2016   HCT 38.0 03/25/2016   MCV 95.1 03/25/2016   PLT 235 03/25/2016      Chemistry      Component Value Date/Time   NA 141 03/25/2016 1405   K 3.9 03/25/2016 1405   CL 107 10/12/2007 0443   CO2 25 03/25/2016 1405   BUN 20.4 03/25/2016 1405   CREATININE 0.8 03/25/2016 1405      Component Value Date/Time   CALCIUM 9.4 03/25/2016 1405   ALKPHOS 71 03/25/2016 1405   AST 25 03/25/2016 1405   ALT 22 03/25/2016 1405   BILITOT 0.37 03/25/2016 1405       RADIOGRAPHIC STUDIES: Mr Jeri Cos [id number] Contrast  Result Date: 03/20/2016 CLINICAL DATA:  Lung mass.  Screening for metastases. EXAM: MRI HEAD WITHOUT AND WITH CONTRAST TECHNIQUE: Multiplanar, multiecho pulse sequences of the  brain and surrounding structures were obtained without and with intravenous contrast. Creatinine was obtained on site at [place] at [address] Results: Creatinine 0.8 mg/dL. CONTRAST:  73mL MULTIHANCE GADOBENATE DIMEGLUMINE 529 MG/ML IV SOLN COMPARISON:  None. FINDINGS: Calvarium and upper cervical spine: No focal marrow signal abnormality. Orbits: Negative. Sinuses and Mastoids: Clear. Brain:  Negative for abnormal intracranial enhancement. Patchy FLAIR hyperintensities in the cerebral white matter and pons from moderate chronic microvascular disease. Mild ventriculomegaly attributed to central predominant volume loss. No acute infarct, hemorrhage, swelling, extra-axial  collection, or major vessel occlusion. IMPRESSION: 1. Negative for intracranial metastasis. 2. Moderate chronic microvascular disease. Electronically Signed   By: Monte Fantasia M.D.   On: 03/20/2016 15:13   Nm Pet Image Initial (pi) Skull Base To Thigh  Result Date: 03/03/2016 CLINICAL DATA:  Initial treatment strategy for lung mass. EXAM: NUCLEAR MEDICINE PET SKULL BASE TO THIGH TECHNIQUE: 8.8 mCi F-18 FDG was injected intravenously. Full-ring PET imaging was performed from the skull base to thigh after the radiotracer. CT data was obtained and used for attenuation correction and anatomic localization. FASTING BLOOD GLUCOSE:  Value: 107 mg/dl COMPARISON:  CT chest dated 01/30/2016 FINDINGS: NECK No hypermetabolic lymph nodes in the neck. CHEST Abrupt cutoff of the right upper lobe bronchus (series 8/ image 23). Suspected focal endobronchial/perihilar lesion with associated hypermetabolism, max SUV 4.3. Secondary platelike volume loss/atelectasis in the right upper lobe (series 8/ image 24). Evaluation of the lung parenchyma is constrained by respiratory motion. Mild subpleural reticulation/scarring in the right lung no suspicious pulmonary nodules. No focal consolidation. No pleural effusion or pneumothorax. Mild cardiomegaly. No pericardial effusion. Atherosclerotic calcifications of the aortic arch. Small mediastinal lymph nodes, including a 10 mm short axis low right paratracheal node (series 4/image 58), max SUV 3.6. ABDOMEN/PELVIS No abnormal hypermetabolic activity within the liver, pancreas, adrenal glands, or spleen. Atherosclerotic calcifications of the abdominal aorta and branch vessels. Colonic diverticulosis, without evidence of diverticulitis. No hypermetabolic lymph nodes in the abdomen or pelvis. SKELETON No focal hypermetabolic activity to suggest skeletal metastasis. Healing fractures of the right anterior 3rd and 4th ribs as well as the left anterior 5th and 6th ribs. Associated  hypermetabolism, max SUV 6.6, posttraumatic. Right hip arthroplasty. Degenerative changes the visualized thoracolumbar spine. IMPRESSION: Hypermetabolic endobronchial/right perihilar lesion with abrupt cut off of the right upper lobe bronchus. This appearance raises concern for primary bronchogenic neoplasm. An endobronchial lesion such as carcinoid remains within the differential. Hypermetabolic 10 mm short axis low right paratracheal node, metastasis not excluded. Healing anterior rib fractures bilaterally, as described above. Electronically Signed   By: Julian Hy M.D.   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   PLAN: I had a lengthy discussion with the patient today about her current disease status and treatment options. I explained to the patient that surgical resection is probably the best option for her case if she is a good surgical candidate. I strongly recommended for her to proceed with the recommendation of Dr. Prescott Gum considering bronchoscopy with endobronchial ultrasound in addition to right VATS with right upper lobectomy depending on the lymph node status. I will arrange for the patient to come back for follow-up visit few weeks after her surgical resection for evaluation and discussion of any adjuvant therapy if needed. If  the patient is not a good surgical candidate and the bronchoscopy confirmed the presence of non-small cell lung cancer, she may benefit from a course of concurrent chemoradiation as an alternative option. The patient agreed to the current plan. She was advised to call immediately if she has any concerning symptoms in the interval.  The patient voices understanding of current disease status and treatment options and is in agreement with the current care  plan.  All questions were answered. The patient knows to call the clinic with any problems, questions or concerns. We can certainly see the patient much sooner if necessary.  Thank you so much for allowing me to participate in the care of Rose Dunn. I will continue to follow up the patient with you and assist in her care.  I spent 40 minutes counseling the patient face to face. The total time spent in the appointment was 60 minutes.  Disclaimer: This note was dictated with voice recognition software. Similar sounding words can inadvertently be transcribed and may not be corrected upon review.   Aubreyana Saltz K. March 25, 2016, 3:17 PM

## 2016-04-01 ENCOUNTER — Ambulatory Visit: Payer: Medicare Other | Admitting: Cardiothoracic Surgery

## 2016-04-10 ENCOUNTER — Encounter: Payer: Self-pay | Admitting: Cardiovascular Disease

## 2016-04-10 ENCOUNTER — Ambulatory Visit (INDEPENDENT_AMBULATORY_CARE_PROVIDER_SITE_OTHER): Payer: Medicare Other | Admitting: Cardiovascular Disease

## 2016-04-10 VITALS — BP 132/79 | HR 86 | Ht 62.5 in | Wt 176.8 lb

## 2016-04-10 DIAGNOSIS — I10 Essential (primary) hypertension: Secondary | ICD-10-CM

## 2016-04-10 DIAGNOSIS — M150 Primary generalized (osteo)arthritis: Secondary | ICD-10-CM

## 2016-04-10 DIAGNOSIS — M159 Polyosteoarthritis, unspecified: Secondary | ICD-10-CM

## 2016-04-10 DIAGNOSIS — E785 Hyperlipidemia, unspecified: Secondary | ICD-10-CM

## 2016-04-10 DIAGNOSIS — Z01818 Encounter for other preprocedural examination: Secondary | ICD-10-CM

## 2016-04-10 DIAGNOSIS — M1990 Unspecified osteoarthritis, unspecified site: Secondary | ICD-10-CM | POA: Insufficient documentation

## 2016-04-10 NOTE — Patient Instructions (Signed)
Medication Instructions:  Your physician recommends that you continue on your current medications as directed. Please refer to the Current Medication list given to you today.  Labwork: none  Testing/Procedures: Your physician has requested that you have an echocardiogram. Echocardiography is a painless test that uses sound waves to create images of your heart. It provides your doctor with information about the size and shape of your heart and how well your heart's chambers and valves are working. This procedure takes approximately one hour. There are no restrictions for this procedure. [facility] [place] [address]  Follow-Up: AS NEEDED   If you need a refill on your cardiac medications before your next appointment, please call your pharmacy.

## 2016-04-10 NOTE — Progress Notes (Signed)
Cardiology Office Note   Date:  04/10/2016   ID:  Rose Dunn, DOB Jul 08, 1941, MRN [id number]  PCP:  Vidal Schwalbe, MD  Cardiologist:   Skeet Latch, MD   Chief Complaint  Patient presents with  . New Patient (Initial Visit)    stress test needed for cardiac clearance      History of Present Illness: Rose Dunn is a 75 y.o. female with COPD, hypertension, GERD, hypothyroidism, hyperlipidemia, lung mass of unknown significance, and prior tobacco abuse who presents for pre-surgical clearance prior to lobectomy.  Rose Dunn fell 01/2016 and suffered trauma to her right side. She had a CT scan without contrast that showed a linear opacity in Rose right upper lobe. A bronchoscopy was recommended. However, she had significant bleeding during Rose procedure and it was aborted. She had a PET scan on 03/03/16 that showed hypermetabolic activity in Rose right upper lobe. There was concern for a primary bronchogenic neoplasm or carcinoid tumor. She was seen by Dr. Nils Pyle and he recommended bronchoscopy with endobronchial ultrasound and biopsy of Rose precarinal lymph node +/- VATS.  She was evaluated by Dr. Curt Bears of Oncology on 03/25/16.  Further plans for her treatment will be made after her biopsy/resection.  Rose Dunn has otherwise been feeling well. Prior to her fall she was going to [employer] 3 times per week. She exercises without chest pain or shortness of breath. She is able to easily walk more than 4 blocks. She can walk up a flight of stairs but has arthritis in her left hip that makes this difficult. She has not noted any lower extremity edema, orthopnea, or PND.     Past Medical History:  Diagnosis Date  . Atelectasis of right lung   . COPD (chronic obstructive pulmonary disease) ([place])   . Lung mass   . Osteoarthritis   . Osteoporosis   . Rib fractures     Past Surgical History:  Procedure Laterality Date  . HYSTERECTOMY ABDOMINAL WITH  SALPINGECTOMY       Current Outpatient Prescriptions  Medication Sig Dispense Refill  . Ascorbic Acid (VITAMIN C) 1000 MG tablet Take 1,000 mg by mouth daily.    Marland Kitchen aspirin EC 81 MG tablet Take 81 mg by mouth daily.    Marland Kitchen atorvastatin (LIPITOR) 80 MG tablet Take 80 mg by mouth daily.    Marland Kitchen BIOTIN 5000 PO Take 1 tablet by mouth daily.    . Calcium Carbonate-Vitamin D (CALCIUM 600+D) 600-200 MG-UNIT TABS Take 1 tablet by mouth 2 (two) times daily.    Marland Kitchen desvenlafaxine (PRISTIQ) 100 MG 24 hr tablet Take 100 mg by mouth daily.    Marland Kitchen FOLIC ACID PO Take [id number] mcg by mouth.    . hydrochlorothiazide (HYDRODIURIL) 25 MG tablet Take 25 mg by mouth daily.    Marland Kitchen levothyroxine (SYNTHROID, LEVOTHROID) 125 MCG tablet Take 125 mcg by mouth daily before breakfast.    . LORazepam (ATIVAN) 0.5 MG tablet Take 0.5 mg by mouth as needed for anxiety.    . Multiple Vitamins-Minerals (MULTIVITAMIN ADULT PO) Take 1 tablet by mouth daily.    . nebivolol (BYSTOLIC) 5 MG tablet Take 5 mg by mouth daily.    . pantoprazole (PROTONIX) 40 MG tablet Take 40 mg by mouth 2 (two) times daily.    . Probiotic Product (SOLUBLE FIBER/PROBIOTICS PO) Take 1 tablet by mouth daily. 30 billion=dosage    . raloxifene (EVISTA) 60 MG tablet Take 60 mg by mouth  daily.    . SUPER B COMPLEX/C PO Take by mouth.     No current facility-administered medications for this visit.     Allergies:   Amoxicillin; Penicillins; Vortioxetine; Lisinopril; Metoprolol succinate er; and Propoxyphene    Social History:  Rose patient  reports that she quit smoking about 17 years ago. Her smoking use included Cigarettes. She has a 14.00 pack-year smoking history. She has never used smokeless tobacco. She reports that she drinks alcohol. She reports that she does not use drugs.   Dunn History:  Rose Dunn history includes Alzheimer's disease in her mother; CAD in her father; Lung cancer in her maternal grandmother; Prostate cancer in her brother and  father.    ROS:  Please see Rose history of present illness.   Otherwise, review of systems are positive for none.   All other systems are reviewed and negative.    PHYSICAL EXAM: VS:  BP 132/79   Pulse 86   Ht 5' 2.5" (1.588 m)   Wt 176 lb 12.8 oz (80.2 kg)   BMI 31.82 kg/m  , BMI Body mass index is 31.82 kg/m. GENERAL:  Well appearing HEENT:  Pupils equal round and reactive, fundi not visualized, oral mucosa unremarkable NECK:  No jugular venous distention, waveform within normal limits, carotid upstroke brisk and symmetric, no bruits, no thyromegaly LYMPHATICS:  No cervical adenopathy LUNGS:  Clear to auscultation bilaterally HEART:  RRR.  PMI not displaced or sustained,S1 and S2 within normal limits, no S3, no S4, no clicks, no rubs, no murmurs ABD:  Flat, positive bowel sounds normal in frequency in pitch, no bruits, no rebound, no guarding, no midline pulsatile mass, no hepatomegaly, no splenomegaly EXT:  2 plus pulses throughout, no edema, no cyanosis no clubbing SKIN:  No rashes no nodules NEURO:  Cranial nerves II through XII grossly intact, motor grossly intact throughout PSYCH:  Cognitively intact, oriented to person place and time    EKG:  EKG is ordered today. Rose ekg ordered today demonstrates sinus rhythm rate 86 bpm.   Recent Labs: 03/25/2016: ALT 22; BUN 20.4; Creatinine 0.8; HGB 12.8; Platelets 235; Potassium 3.9; Sodium 141    Lipid Panel No results found for: CHOL, TRIG, HDL, CHOLHDL, VLDL, LDLCALC, LDLDIRECT    Wt Readings from Last 3 Encounters:  04/10/16 176 lb 12.8 oz (80.2 kg)  03/25/16 179 lb 12.8 oz (81.6 kg)  03/11/16 175 lb (79.4 kg)      ASSESSMENT AND PLAN:  # Pre-Surgical Clearance: Rose patient does not have any unstable cardiac conditions.  Upon evaluation today, she can achieve 4 METs or greater without anginal symptoms.  According to [facility] and AHA guidelines, she requires no further cardiac workup prior to her noncardiac surgery and  should be at acceptable risk.  her NSQIP risk of peri-procedural MI or cardiac arrest is 0.13%.  Our service is available as necessary in Rose perioperative period.  It is fine to hold aspirin perioperatively.  # Hypertension: Blood pressure is well-controlled on hydrochlorothiazide and nebivolol.  No changes recommended at this time.  # Hyperlipidemia: Lipids are managed by her PCP. Continue atorvastatin.     Current medicines are reviewed at length with Rose patient today.  Rose patient does not have concerns regarding medicines.  Rose following changes have been made:  no change  Labs/ tests ordered today include:   Orders Placed This Encounter  Procedures  . EKG 12-Lead  . ECHOCARDIOGRAM COMPLETE     Disposition:  FU with Yomira Flitton C. Oval Linsey, MD, [facility] as needed.    This note was written with Rose assistance of speech recognition software.  Please excuse any transcriptional errors.  Signed, Jakeel Starliper C. Oval Linsey, MD, [facility]  04/10/2016 1:32 PM    [facility]

## 2016-04-15 ENCOUNTER — Encounter: Payer: Self-pay | Admitting: Cardiothoracic Surgery

## 2016-04-15 ENCOUNTER — Other Ambulatory Visit: Payer: Self-pay | Admitting: *Deleted

## 2016-04-15 ENCOUNTER — Ambulatory Visit (INDEPENDENT_AMBULATORY_CARE_PROVIDER_SITE_OTHER): Payer: Medicare Other | Admitting: Cardiothoracic Surgery

## 2016-04-15 VITALS — BP 135/69 | HR 89 | Resp 16 | Ht 62.5 in | Wt 175.0 lb

## 2016-04-15 DIAGNOSIS — J9811 Atelectasis: Secondary | ICD-10-CM

## 2016-04-15 DIAGNOSIS — R918 Other nonspecific abnormal finding of lung field: Secondary | ICD-10-CM | POA: Diagnosis not present

## 2016-04-15 DIAGNOSIS — J984 Other disorders of lung: Secondary | ICD-10-CM

## 2016-04-15 NOTE — Progress Notes (Signed)
PCP is Vidal Schwalbe, MD Referring Provider is Curt Bears, MD  Chief Complaint  Patient presents with  . Lung Mass    f/u to review MR Brain 03/20/16, Cardiac Clearance 04/10/16 and consult with Dr. 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 Attempted biopsy by her pulmonologist was, complicated by bleeding and nondiagnostic. PET scan shows the 2.5 cm nodule to be hypermetabolic 4.3 SUV. There is a right paratracheal node measuring 12 mm which is 3.2 SUV. Brain MRI is negative. The patient has been evaluated by Dr. Julien Nordmann who agrees that surgical resection is indicated if the 4R paratracheal node is sampled at the time of surgery with bronchoscopy and is negative on quick prep. The patient's PFTs show greater than 90% FEV1, 92% FVC and 62% diffusion capacity. The patient has not smoked in 15-20 years.  The patient has been evaluated by Dr. Oval Linsey who feels the patient is not at risk for cardiac and has a echocardiogram planned for next week.  The patient currently has no pulmonary symptoms of productive cough congestion fever.  The patient previously has successfully had a right total hip replacement without anesthetic problems. She is allergic to penicillin.  Past Medical History:  Diagnosis Date  . Atelectasis of right lung   . COPD (chronic obstructive pulmonary disease) ([place])   . Lung mass   . Osteoarthritis   . Osteoporosis   . Rib fractures     Past Surgical History:  Procedure Laterality Date  . HYSTERECTOMY ABDOMINAL WITH SALPINGECTOMY      Family History  Problem Relation Age of Onset  . Alzheimer's disease Mother   . Prostate cancer Father   . CAD Father   . Prostate cancer Brother   . Lung  cancer Maternal Grandmother     Social History Social History  Substance Use Topics  . Smoking status: Former Smoker    Packs/day: 1.00    Years: 14.00    Types: Cigarettes    Quit date: 03/12/1999  . Smokeless tobacco: Never Used  . Alcohol use Yes     Comment: 1-2 DRINKS/DAY    Current Outpatient Prescriptions  Medication Sig Dispense Refill  . Ascorbic Acid (VITAMIN C) 1000 MG tablet Take 1,000 mg by mouth daily.    Marland Kitchen aspirin EC 81 MG tablet Take 81 mg by mouth daily.    Marland Kitchen atorvastatin (LIPITOR) 80 MG tablet Take 80 mg by mouth daily.    Marland Kitchen BIOTIN 5000 PO Take 1 tablet by mouth daily.    . Calcium Carbonate-Vitamin D (CALCIUM 600+D) 600-200 MG-UNIT TABS Take 1 tablet by mouth 2 (two) times daily.    Marland Kitchen desvenlafaxine (PRISTIQ) 100 MG 24 hr tablet Take 100 mg by mouth daily.    Marland Kitchen FOLIC ACID PO Take [id number] mcg by mouth.    . hydrochlorothiazide (HYDRODIURIL) 25 MG tablet Take 25 mg by mouth daily.    Marland Kitchen levothyroxine (SYNTHROID, LEVOTHROID) 125 MCG tablet Take 125 mcg by mouth daily before breakfast.    . LORazepam (ATIVAN) 0.5 MG tablet Take 0.5 mg by mouth as needed for anxiety.    . Multiple Vitamins-Minerals (MULTIVITAMIN ADULT PO) Take 1 tablet by mouth daily.    . nebivolol (BYSTOLIC) 5 MG tablet Take 5 mg by mouth daily.    Marland Kitchen  pantoprazole (PROTONIX) 40 MG tablet Take 40 mg by mouth 2 (two) times daily.    . Probiotic Product (SOLUBLE FIBER/PROBIOTICS PO) Take 1 tablet by mouth daily. 30 billion=dosage    . raloxifene (EVISTA) 60 MG tablet Take 60 mg by mouth daily.    . SUPER B COMPLEX/C PO Take by mouth.     No current facility-administered medications for this visit.     Allergies  Allergen Reactions  . Amoxicillin Anaphylaxis  . Penicillins Anaphylaxis  . Vortioxetine Swelling    Worse depression, achy, edema  . Lisinopril Other (See Comments)  . Metoprolol Succinate Er Other (See Comments)  . Propoxyphene     Review of Systems        Review of Systems :  [ y  ] = yes, [  ] = no        General :  Weight gain [   ]    Weight loss  [   ]  Fatigue [  ]  Fever [  ]  Chills  [  ]                                Weakness  [  ]           HEENT    Headache [  ]  Dizziness [  ]  Blurred vision [  ] Glaucoma  [  ]                          Nosebleeds [  ] Painful or loose teeth [  ]        Cardiac :  Chest pain/ pressure [  ]  Resting SOB [  ] exertional SOB [  ]                        Orthopnea [  ]  Pedal edema  [  ]  Palpitations [  ] Syncope/presyncope [ ]                         Paroxysmal nocturnal dyspnea [  ]         Pulmonary : cough [  ]  wheezing [  ]  Hemoptysis [  ] Sputum [  ] Snoring [  ]                              Pneumothorax [  ]  Sleep apnea [  ]        GI : Vomiting [  ]  Dysphagia [  ]  Melena  [  ]  Abdominal pain [  ] BRBPR [  ]              Heart burn [  ]  Constipation [  ] Diarrhea  [  ] Colonoscopy [   ]        GU : Hematuria [  ]  Dysuria [  ]  Nocturia [  ] UTI's [  ]        Vascular : Claudication [  ]  Rest pain [  ]  DVT [  ] Vein stripping [  ] leg ulcers [  ]  TIA [  ] Stroke [  ]  Varicose veins [  ]        NEURO :  Headaches  [  ] Seizures [  ] Vision changes [  ] Paresthesias [  ]                                       Seizures [  ]right-hand-dominant        Musculoskeletal :  Arthritis [ mild ] Gout  [  ]  Back pain [  ]  Joint pain [  ]status post right hip replacement        Skin :  Rash [  ]  Melanoma [  ] Sores [  ]        Heme : Bleeding problems [  ]Clotting Disorders [  ] Anemia [  ]Blood Transfusion [ ]         Endocrine : Diabetes [  ] Heat or Cold intolerance [  ] Polyuria [  ]excessive thirst [ ]         Psych : Depression [  ]  Anxiety [  ]  Psych hospitalizations [  ] Memory change [  ]                                               BP 135/69   Pulse 89   Resp 16   Ht 5' 2.5" (1.588 m)   Wt 175 lb (79.4 kg)   SpO2 98% Comment: RA  BMI 31.50 kg/m  Physical Exam       Physical Exam  General: Very nice 75 year old Caucasian female no acute distress accompanied by family and friend HEENT: Normocephalic pupils equal , dentition adequate Neck: Supple without JVD, adenopathy, or bruit Chest: Clear to auscultation, symmetrical breath sounds, no rhonchi, no tenderness             or deformity Cardiovascular: Regular rate and rhythm, no murmur, no gallop, peripheral pulses             palpable in all extremities Abdomen:  Soft, nontender, no palpable mass or organomegaly Extremities: Warm, well-perfused, no clubbing cyanosis edema or tenderness,              no venous stasis changes of the legs Rectal/GU: Deferred Neuro: Grossly non--focal and symmetrical throughout Skin: Clean and dry without rash or ulceration   Diagnostic Tests: CT scan and PET scan show cutoff of a right upper lobe segment from endobronchial mass 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 There is a 10-12 millimeter 4R lymph node with slight hypermetabolic activity on PET scan. This will be sampled with bronchoscopic ultrasound directed biopsy EBUS at the time of VATS. If the mediastinal lymph node is positive for malignancy then surgery will be canceled and the patient be evaluated by oncology for chemotherapy..  Impression: Right upper lobe mass visualized on bronchoscopy with non-diagnostic biopsy. Mildly hypermetabolic mediastinal lymph node[right paratracheal] Brain MR negative Good PFTs Good cardiac status pending preop echocardiogram after risks assessment by Dr. Oval Linsey  Plan: Bronchoscopy with lymph node biopsy followed by right VATS and right upper lobectomy scheduled for September 12 at [facility] hospital. I discussed the procedure in detail with the patient including indications benefits risks alternatives. She understands and agrees  to proceed.   Len Childs, MD [facility] [phone]

## 2016-04-22 ENCOUNTER — Other Ambulatory Visit: Payer: Self-pay

## 2016-04-22 ENCOUNTER — Ambulatory Visit (HOSPITAL_COMMUNITY): Payer: Medicare Other | Attending: Internal Medicine

## 2016-04-22 DIAGNOSIS — R29898 Other symptoms and signs involving the musculoskeletal system: Secondary | ICD-10-CM | POA: Diagnosis not present

## 2016-04-22 DIAGNOSIS — Z87891 Personal history of nicotine dependence: Secondary | ICD-10-CM | POA: Diagnosis not present

## 2016-04-22 DIAGNOSIS — I071 Rheumatic tricuspid insufficiency: Secondary | ICD-10-CM | POA: Insufficient documentation

## 2016-04-22 DIAGNOSIS — I340 Nonrheumatic mitral (valve) insufficiency: Secondary | ICD-10-CM | POA: Insufficient documentation

## 2016-04-22 DIAGNOSIS — J449 Chronic obstructive pulmonary disease, unspecified: Secondary | ICD-10-CM | POA: Diagnosis not present

## 2016-04-22 DIAGNOSIS — Z01810 Encounter for preprocedural cardiovascular examination: Secondary | ICD-10-CM | POA: Insufficient documentation

## 2016-04-22 DIAGNOSIS — I5189 Other ill-defined heart diseases: Secondary | ICD-10-CM | POA: Diagnosis not present

## 2016-04-22 DIAGNOSIS — Z01818 Encounter for other preprocedural examination: Secondary | ICD-10-CM

## 2016-04-23 NOTE — Pre-Procedure Instructions (Signed)
    Rose Dunn  04/23/2016    Your procedure is scheduled on Tuesday, September 12.  Report to [facility] Admitting at 5:30 AM                  Your surgery or procedure is scheduled for 7:30    Call this number if you have problems the morning of surgery:[phone]   Remember:  Do not eat food or drink liquids after midnight Monday, September 11.  Take these medicines the morning of surgery with A SIP OF WATER :atorvastatin (LIPITOR), desvenlafaxine (PRISTIQ), levothyroxine (SYNTHROID, LEVOTHROID), nebivolol (BYSTOLIC), pantoprazole (PROTONIX).             Today, September 8-       Stop taking Aspirin  Herbal medications.  Do not take any NSAIDs ie: Ibuprofen,  Advil,Naproxen or any medication containing Aspirin.  Do not wear jewelry, make-up or nail polish.  Do not wear lotions, powders, or perfumes, or deodorant.  Do not shave 48 hours prior to surgery.    Do not bring valuables to the hospital.  [facility] is not responsible for any belongings or valuables.  Contacts, dentures or bridgework may not be worn into surgery.  Leave your suitcase in the car.  After surgery it may be brought to your room.  For patients admitted to the hospital, discharge time will be determined by your treatment team.  Special instructions:  Review  [place] - Preparing For Surgery.  Please read over the following fact sheets that you were given. [facility]- Preparing For Surgery and Patient Instructions for Mupirocin Application

## 2016-04-24 ENCOUNTER — Encounter (HOSPITAL_COMMUNITY): Payer: Self-pay

## 2016-04-24 ENCOUNTER — Encounter (HOSPITAL_COMMUNITY)
Admission: RE | Admit: 2016-04-24 | Discharge: 2016-04-24 | Disposition: A | Discharge status: Home / Self Care | Payer: Medicare Other | Source: Ambulatory Visit | Attending: Cardiothoracic Surgery | Admitting: Cardiothoracic Surgery

## 2016-04-24 ENCOUNTER — Ambulatory Visit (HOSPITAL_COMMUNITY)
Admission: RE | Admit: 2016-04-24 | Discharge: 2016-04-24 | Disposition: A | Discharge status: Home / Self Care | Payer: Medicare Other | Source: Ambulatory Visit | Attending: Cardiothoracic Surgery | Admitting: Cardiothoracic Surgery

## 2016-04-24 DIAGNOSIS — Z01818 Encounter for other preprocedural examination: Secondary | ICD-10-CM | POA: Diagnosis present

## 2016-04-24 DIAGNOSIS — I700 Atherosclerosis of aorta: Secondary | ICD-10-CM | POA: Diagnosis not present

## 2016-04-24 DIAGNOSIS — J984 Other disorders of lung: Secondary | ICD-10-CM | POA: Insufficient documentation

## 2016-04-24 HISTORY — DX: Essential (primary) hypertension: I10

## 2016-04-24 HISTORY — DX: Depression, unspecified: F32.A

## 2016-04-24 HISTORY — DX: Major depressive disorder, single episode, unspecified: F32.9

## 2016-04-24 HISTORY — DX: Headache: R51

## 2016-04-24 HISTORY — DX: Pneumonia, unspecified organism: J18.9

## 2016-04-24 HISTORY — DX: Gastro-esophageal reflux disease without esophagitis: K21.9

## 2016-04-24 HISTORY — DX: Malignant (primary) neoplasm, unspecified: C80.1

## 2016-04-24 HISTORY — DX: Hypothyroidism, unspecified: E03.9

## 2016-04-24 HISTORY — DX: Anxiety disorder, unspecified: F41.9

## 2016-04-24 HISTORY — DX: Personal history of other medical treatment: Z92.89

## 2016-04-24 HISTORY — DX: Headache, unspecified: R51.9

## 2016-04-24 LAB — CBC
HCT: 40.1 % (ref 36.0–46.0)
Hemoglobin: 13.6 g/dL (ref 12.0–15.0)
MCH: 31.9 pg (ref 26.0–34.0)
MCHC: 33.9 g/dL (ref 30.0–36.0)
MCV: 94.1 fL (ref 78.0–100.0)
Platelets: 231 10*3/uL (ref 150–400)
RBC: 4.26 MIL/uL (ref 3.87–5.11)
RDW: 13.1 % (ref 11.5–15.5)
WBC: 8.4 10*3/uL (ref 4.0–10.5)

## 2016-04-24 LAB — TYPE AND SCREEN
ABO/RH(D): O POS
Antibody Screen: NEGATIVE

## 2016-04-24 LAB — COMPREHENSIVE METABOLIC PANEL
ALT: 23 U/L (ref 14–54)
AST: 32 U/L (ref 15–41)
Albumin: 3.9 g/dL (ref 3.5–5.0)
Alkaline Phosphatase: 60 U/L (ref 38–126)
Anion gap: 11 (ref 5–15)
BUN: 18 mg/dL (ref 6–20)
CO2: 20 mmol/L — ABNORMAL LOW (ref 22–32)
Calcium: 9.8 mg/dL (ref 8.9–10.3)
Chloride: 105 mmol/L (ref 101–111)
Creatinine, Ser: 0.77 mg/dL (ref 0.44–1.00)
GFR calc Af Amer: >60 mL/min (ref >60)
GFR calc non Af Amer: >60 mL/min (ref >60)
Glucose, Bld: 111 mg/dL — ABNORMAL HIGH (ref 65–99)
Potassium: 4.3 mmol/L (ref 3.5–5.1)
Sodium: 136 mmol/L (ref 135–145)
Total Bilirubin: 0.6 mg/dL (ref 0.3–1.2)
Total Protein: 6.9 g/dL (ref 6.5–8.1)

## 2016-04-24 LAB — URINALYSIS, ROUTINE W REFLEX MICROSCOPIC
Bilirubin Urine: NEGATIVE
Glucose, UA: NEGATIVE mg/dL
Hgb urine dipstick: NEGATIVE
Ketones, ur: NEGATIVE mg/dL
Leukocytes, UA: NEGATIVE
Nitrite: NEGATIVE
Protein, ur: NEGATIVE mg/dL
Specific Gravity, Urine: 1.019 (ref 1.005–1.030)
pH: 6 (ref 5.0–8.0)

## 2016-04-24 LAB — BLOOD GAS, ARTERIAL
Acid-base deficit: 0.1 mmol/L (ref 0.0–2.0)
Bicarbonate: 23.7 mmol/L (ref 20.0–28.0)
Drawn by: 206361
FIO2: 0.21
O2 Saturation: 95.1 %
Patient temperature: 98.6
pCO2 arterial: 36.1 mmHg (ref 32.0–48.0)
pH, Arterial: 7.433 (ref 7.350–7.450)
pO2, Arterial: 76.4 mmHg — ABNORMAL LOW (ref 83.0–108.0)

## 2016-04-24 LAB — SURGICAL PCR SCREEN
MRSA, PCR: NEGATIVE
Staphylococcus aureus: NEGATIVE

## 2016-04-24 LAB — PROTIME-INR
INR: 0.94
Prothrombin Time: 12.6 seconds (ref 11.4–15.2)

## 2016-04-24 LAB — ABO/RH: ABO/RH(D): O POS

## 2016-04-24 LAB — APTT: aPTT: 27 seconds (ref 24–36)

## 2016-04-27 ENCOUNTER — Other Ambulatory Visit: Payer: Self-pay | Admitting: *Deleted

## 2016-04-27 ENCOUNTER — Telehealth: Payer: Self-pay | Admitting: *Deleted

## 2016-04-27 ENCOUNTER — Encounter (HOSPITAL_COMMUNITY): Payer: Self-pay | Admitting: Emergency Medicine

## 2016-04-27 MED ORDER — VANCOMYCIN HCL IN DEXTROSE 1-5 GM/200ML-% IV SOLN: 1000.0000 mg | INTRAVENOUS | Status: DC

## 2016-04-27 NOTE — Progress Notes (Signed)
Anesthesia Chart Review:  Pt is a 75 year old female scheduled for video bronchoscopy with endobronchial ultrasound, VATS, R lobectomy on 04/28/2016 with Ivin Poot, MD.   PMH includes:  HTN, COPD, lung mass, hypothyroidism, GERD. Former smoker. BMI 32  Medications include: albuterol, ASA, lipitor, hctz, levothyroxine, nebivolol, protonix  Preoperative labs reviewed.    CXR 04/24/16:  1. Chronic bronchitic change. Stable scarring inferiorly in the right upper lobe. 2. No CHF or acute pneumonia. 3. Aortic atherosclerosis.  EKG 04/10/16: NSR. Nonspecific intraventricular block.   Echo 04/22/16:  - Left ventricle: The cavity size was normal. Wall thickness was normal. Systolic function was mildly to moderately reduced. The estimated ejection fraction was in the range of 40% to 45%. Inferior and septal severe hypokinesis to akinesis. Doppler parameters are consistent with abnormal left ventricular relaxation (grade 1 diastolic dysfunction). The E/e&' ratio is >15, suggesting elevated LV filling pressure. - Mitral valve: Sclerotic leaflet tips. Trace to mild mitral regurgitation. Calcified annulus. - Left atrium: The atrium was mildly dilated. - Tricuspid valve: There was trivial regurgitation. - Pulmonary arteries: PA peak pressure: 29 mm Hg (S). - Inferior vena cava: The vessel was normal in size. The respirophasic diameter changes were in the normal range (>= 50%), consistent with normal central venous pressure. - Impressions: LVEF 40-45%, inferior and septal severe hypokinesis to akinesis, normal wall thickness, diastolic dysfunction with elevated LV filling pressure, trace to mild MR, mild LAE, trivial TR, RVSP 29 mmHg, normal IVC.  Dr. Oval Linsey comments: "Echo shows that her heart muscle is weakened. Given this finding we should get a stress test. Please get an exercise Myoview. Follow up after stress test."  This has not happened yet.   Willeen Cass, FNP-BC [facility]  [facility]/Anesthesiology Phone: [phone] 04/27/2016 4:33 PM

## 2016-04-27 NOTE — Telephone Encounter (Signed)
Patient's surgery scheduled with Dr. Prescott Gum for Tuesday 9/12 has been postponed due to a recent echocardiogram showing low EF.  Stress test will be ordered by Dr. Oval Linsey.  Patient understands.  We will get her re-scheduled with Dr. Prescott Gum ASAP.

## 2016-04-28 ENCOUNTER — Telehealth: Payer: Self-pay | Admitting: *Deleted

## 2016-04-28 ENCOUNTER — Inpatient Hospital Stay (HOSPITAL_COMMUNITY): Admission: RE | Admit: 2016-04-28 | Payer: Medicare Other | Source: Ambulatory Visit | Admitting: Cardiothoracic Surgery

## 2016-04-28 ENCOUNTER — Encounter (HOSPITAL_COMMUNITY): Admission: RE | Payer: Self-pay | Source: Ambulatory Visit

## 2016-04-28 DIAGNOSIS — R931 Abnormal findings on diagnostic imaging of heart and coronary circulation: Secondary | ICD-10-CM

## 2016-04-28 DIAGNOSIS — R9430 Abnormal result of cardiovascular function study, unspecified: Secondary | ICD-10-CM

## 2016-04-28 SURGERY — BRONCHOSCOPY, WITH EBUS
Anesthesia: General | Site: Chest | Laterality: Right

## 2016-04-28 NOTE — Telephone Encounter (Signed)
-----   Message from Skeet Latch, MD sent at 04/28/2016 12:54 PM EDT ----- Regarding: stress tes Hi Raechal Raben,  Please schedule for Ms. Julich to get an exercise Myoview asap.  She is scheduled for surgery but had an echo that is abnormal.  She needs this before she can have surgery.    Thanks, [employer]

## 2016-04-28 NOTE — Telephone Encounter (Signed)
Advised of echo and need for stress test Sent to scheduling department

## 2016-04-28 NOTE — Telephone Encounter (Signed)
-----   Message from Skeet Latch, MD sent at 04/26/2016 10:17 PM EDT ----- Echo shows that her heart muscle is weakened.  Given this finding we should get a stress test.  Please get an exercise Myoview.  Follow up after stress test.

## 2016-05-06 ENCOUNTER — Telehealth (HOSPITAL_COMMUNITY): Payer: Self-pay

## 2016-05-06 NOTE — Telephone Encounter (Signed)
Encounter complete. 

## 2016-05-08 ENCOUNTER — Ambulatory Visit (HOSPITAL_COMMUNITY)
Admission: RE | Admit: 2016-05-08 | Discharge: 2016-05-08 | Disposition: A | Discharge status: Home / Self Care | Payer: Medicare Other | Source: Ambulatory Visit | Attending: Cardiology | Admitting: Cardiology

## 2016-05-08 DIAGNOSIS — R931 Abnormal findings on diagnostic imaging of heart and coronary circulation: Secondary | ICD-10-CM

## 2016-05-08 DIAGNOSIS — R0989 Other specified symptoms and signs involving the circulatory and respiratory systems: Secondary | ICD-10-CM

## 2016-05-08 DIAGNOSIS — IMO0002 Reserved for concepts with insufficient information to code with codable children: Secondary | ICD-10-CM

## 2016-05-08 DIAGNOSIS — I501 Left ventricular failure: Secondary | ICD-10-CM | POA: Insufficient documentation

## 2016-05-08 DIAGNOSIS — R9430 Abnormal result of cardiovascular function study, unspecified: Secondary | ICD-10-CM

## 2016-05-08 DIAGNOSIS — I252 Old myocardial infarction: Secondary | ICD-10-CM | POA: Insufficient documentation

## 2016-05-08 LAB — MYOCARDIAL PERFUSION IMAGING
CHL CUP NUCLEAR SRS: 13
CHL CUP RESTING HR STRESS: 82 {beats}/min
CHL RATE OF PERCEIVED EXERTION: 19
CSEPEDS: 31 s
CSEPHR: 93 %
Estimated workload: 4.6 METS
Exercise duration (min): 4 min
LV sys vol: 55 mL
LVDIAVOL: 106 mL (ref 46–106)
MPHR: 146 {beats}/min
Peak HR: 136 {beats}/min
SDS: 3
SSS: 16
TID: 1.12

## 2016-05-08 MED ORDER — TECHNETIUM TC 99M TETROFOSMIN IV KIT
30.2000 | PACK | Freq: Once | INTRAVENOUS | Status: AC | PRN
  Administered 2016-05-08: 30.2 via INTRAVENOUS
  Filled 2016-05-08: qty 30

## 2016-05-08 MED ORDER — TECHNETIUM TC 99M TETROFOSMIN IV KIT
10.4000 | PACK | Freq: Once | INTRAVENOUS | Status: AC | PRN
  Administered 2016-05-08: 10.4 via INTRAVENOUS
  Filled 2016-05-08: qty 10

## 2016-05-11 ENCOUNTER — Other Ambulatory Visit: Payer: Self-pay | Admitting: *Deleted

## 2016-05-11 DIAGNOSIS — J984 Other disorders of lung: Secondary | ICD-10-CM

## 2016-05-11 NOTE — Telephone Encounter (Signed)
Moved follow up to 9/28

## 2016-05-14 ENCOUNTER — Ambulatory Visit (INDEPENDENT_AMBULATORY_CARE_PROVIDER_SITE_OTHER): Payer: Medicare Other | Admitting: Cardiovascular Disease

## 2016-05-14 ENCOUNTER — Encounter: Payer: Self-pay | Admitting: Cardiovascular Disease

## 2016-05-14 VITALS — BP 131/76 | HR 97 | Ht 62.0 in | Wt 178.6 lb

## 2016-05-14 DIAGNOSIS — E784 Other hyperlipidemia: Secondary | ICD-10-CM

## 2016-05-14 DIAGNOSIS — I5043 Acute on chronic combined systolic (congestive) and diastolic (congestive) heart failure: Secondary | ICD-10-CM | POA: Diagnosis not present

## 2016-05-14 DIAGNOSIS — R9439 Abnormal result of other cardiovascular function study: Secondary | ICD-10-CM | POA: Diagnosis not present

## 2016-05-14 DIAGNOSIS — I10 Essential (primary) hypertension: Secondary | ICD-10-CM | POA: Diagnosis not present

## 2016-05-14 DIAGNOSIS — E7849 Other hyperlipidemia: Secondary | ICD-10-CM

## 2016-05-14 NOTE — Patient Instructions (Addendum)
Medication Instructions:  Your physician recommends that you continue on your current medications as directed. Please refer to the Current Medication list given to you today.  Labwork: FASTING LP/CMET WEEK BEFORE  CARDIAC CTA AT [facility] ON THE FIRST FLOOR   Testing/Procedures: CARDIAC CTA MID November   Follow-Up: Your physician wants you to follow-up in: [place] will receive a reminder letter in the mail two months in advance. If you don't receive a letter, please call our office to schedule the follow-up appointment.  If you need a refill on your cardiac medications before your next appointment, please call your pharmacy.    Cardiac CT Angiogram A cardiac CT angiogram is a test to help your health care provider find out why you are having chest pains or other symptoms of heart disease. The test uses an advanced type of X-ray machine that scans your heart and the area around the heart and creates multiple pictures of it. Other names for the test are coronary CT angiography, coronary artery scanning, and CTA.  The test is painless and fairly quick. It is noninvasive. That means it does not involve any type of surgery or cuts (incisions). Instead, a fluid called contrast dye is injected into an IV tube in your arm. The contrast dye acts as a highlighter as it flows through the veins. With the CT scan, it lets your health care provider see:   If the coronary arteries in your heart are more narrow than they should be, or if they are blocked.  If there is fluid around the heart.  If the muscles and tissues of the heart look weak or show signs of disease.  If the lungs contain any blood clots. LET [facility] CARE PROVIDER KNOW ABOUT:  Any allergies you have.   All medicines you are taking, including vitamins, herbs, eye drops, creams, and over-the-counter medicines.  Previous problems you or members of your family have had with the use of anesthetics.  Any blood disorders  you have.  Previous surgeries you have had.  Medical conditions you have. RISKS AND COMPLICATIONS Generally, this is a safe procedure. However, as with any procedure, problems can occur. Possible problems include:   Allergic reaction to the contrast dye. This can range from mild to severe and may include:   Itching at the IV tube insertion site.   Redness at the IV tube insertion site.   Hives.   Nausea.   Difficulty breathing.   Kidney failure.   Problems from radiation exposure. This test involves the use of radiation. Radiation exposure can be dangerous to a pregnant patient and fetus. If you are pregnant, shields are used to protect your belly and pelvic area. More details are available from your health care provider. BEFORE THE PROCEDURE  The day before the test:    Stop drinking caffeinated beverages. These include energy drinks, tea, soda, coffee, and hot chocolate.  Stop taking medicines to treat erectile dysfunction. They can interfere with medicines you may be given during the procedure. Check with your health care provider if you should stop taking any other medicines. On the day of the test:   About 4 hours before the test, stop eating and drinking anything but water as advised by your health care provider.  Avoid wearing jewelry. You will have to undress from the waist up and wear a hospital gown. PROCEDURE  The hair on your chest may need to be shaved. This is done because small sticky patches  called electrodes are put on your chest. These transmit information that helps monitor your heart during the test.  You might be given heart medicine during the test. This is done to control your heart rate during the test so a good image is obtained.  An IV tube will be inserted in your arm.  You will be asked to lie on a table with your arms above your head.  The contrast dye will be injected into the IV tube. You might feel warm or you may get a metallic  taste in your mouth.  The table you are lying on will move into a large machine that will do the scanning.  You will be able to see, hear, and talk to the person running the machine while you are in it. Follow that person's directions. You may be asked to hold your breath for 2-3 seconds as pictures are taken.  The CT machine will move around you to take pictures. Do not move while it is scanning. This helps to get a good image of your heart.  When the best possible pictures have been taken, the machine will be turned off. The table will move out of the machine. The IV tube will then be removed. AFTER THE PROCEDURE  You will be allowed to get dressed and return to your normal activities.  Results will be interpreted by the health care provider and the results will be discussed with you.   This information is not intended to replace advice given to you by your health care provider. Make sure you discuss any questions you have with your health care provider.   Document Released: 07/16/2008 Document Revised: 08/24/2014 Document Reviewed: 04/19/2013 Elsevier Interactive Patient Education [employer].

## 2016-05-14 NOTE — Progress Notes (Signed)
Cardiology Office Note   Date:  05/17/2016   ID:  Rose Dunn, DOB Jul 05, 1941, MRN [id number]  PCP:  Rose Schwalbe, MD  Cardiologist:   Rose Latch, MD  CT Surgeon: Dr. Prescott Dunn  Chief Complaint  Patient presents with  . Follow-up    lightheaded; occasionally. cramping in legs at night.     History of Present Illness: Rose Dunn is a 75 y.o. female with COPD, hypertension, GERD, hypothyroidism, hyperlipidemia, lung mass of unknown significance, and prior tobacco abuse who presents for follow up.  She was seen 03/2016 for pre-surgical clearance prior to lobectomy.  Ms. Oelke fell 01/2016 and suffered trauma to her right side. She had a CT scan without contrast that showed a linear opacity in the right upper lobe. A bronchoscopy was recommended. However, she had significant bleeding during the procedure and it was aborted. She had a PET scan on 03/03/16 that showed hypermetabolic activity in the right upper lobe. There was concern for a primary bronchogenic neoplasm or carcinoid tumor. She was seen by Dr. Nils Dunn and he recommended bronchoscopy with endobronchial ultrasound and biopsy of the precarinal lymph node +/- VATS.  She was evaluated by Dr. Curt Dunn of Oncology on 03/25/16.  Further plans for her treatment will be made after her biopsy/resection.  She was seen 04/10/16 for pre-surgical risk assessment.  At that appointment she was deemed low risk for surgery and did not require any ischemia testing.  However, she was referred for an echo 04/22/16 and was noted to have LVEF 40-45% with with septal hypokinesis and grade 1 diastolic dysfunction.  She subsequently had an exercise Myoview that revealed LVEF 48% with a fixed defects in the anteroseptal, inferoseptal, and apical myocardium.  She achieved 4.6 METS of activity.   She has been feeling well and continues to exercise.  She has not noted any lower extremity edema, orthopnea, or PND.  Her only complaint today  is GI related, and she continues to see Dr. Michail Dunn for this.  She presents today to discuss her cardiac testing.   Past Medical History:  Diagnosis Date  . Acute on chronic systolic and diastolic heart failure, NYHA class 1 ([place]) 05/17/2016  . Anxiety   . Atelectasis of right lung   . Cancer (Rose Dunn)    skin cancer - squamous - face,, leg  . COPD (chronic obstructive pulmonary disease) ([place])   . Depression   . Essential hypertension 05/17/2016  . GERD (gastroesophageal reflux disease)   . Headache    Migrine- no longer   . History of blood transfusion   . Hyperlipidemia 05/17/2016  . Hypertension   . Hypothyroidism   . Lung mass   . Osteoarthritis   . Osteoporosis   . Pneumonia 2017  . Rib fractures     Past Surgical History:  Procedure Laterality Date  . COLONOSCOPY    . EXPLORATORY LAPAROTOMY  1969  . EYE SURGERY Bilateral 2017   Cataract with lens- Right 11/04/15, Left 11/25/15  . HYSTERECTOMY ABDOMINAL WITH SALPINGECTOMY    . JOINT REPLACEMENT Right 2009   hip  . THYROIDECTOMY, PARTIAL  1976     Current Outpatient Prescriptions  Medication Sig Dispense Refill  . albuterol (PROVENTIL HFA;VENTOLIN HFA) 108 (90 Base) MCG/ACT inhaler Inhale 2 puffs into the lungs every 4 (four) hours as needed for wheezing or shortness of breath.    . Ascorbic Acid (VITAMIN C) 1000 MG tablet Take 1,000 mg by mouth daily.    Marland Kitchen  aspirin EC 81 MG tablet Take 81 mg by mouth daily.    Marland Kitchen atorvastatin (LIPITOR) 80 MG tablet Take 80 mg by mouth daily.    Marland Kitchen BIOTIN 5000 PO Take 1 tablet by mouth daily.    . Calcium Carbonate-Vitamin D (CALCIUM 600+D) 600-200 MG-UNIT TABS Take 1 tablet by mouth 2 (two) times daily.    Marland Kitchen desvenlafaxine (PRISTIQ) 100 MG 24 hr tablet Take 100 mg by mouth daily.    Marland Kitchen FOLIC ACID PO Take A999333 mcg by mouth.    . hydrochlorothiazide (HYDRODIURIL) 25 MG tablet Take 25 mg by mouth daily.    Marland Kitchen levothyroxine (SYNTHROID, LEVOTHROID) 125 MCG tablet Take 125 mcg by mouth daily  before breakfast.    . LORazepam (ATIVAN) 0.5 MG tablet Take 0.5 mg by mouth as needed for anxiety.    . Multiple Vitamins-Minerals (MULTIVITAMIN ADULT PO) Take 1 tablet by mouth daily.    . nebivolol (BYSTOLIC) 5 MG tablet Take 5 mg by mouth daily.    . pantoprazole (PROTONIX) 40 MG tablet Take 40 mg by mouth 2 (two) times daily.    . Probiotic Product (SOLUBLE FIBER/PROBIOTICS PO) Take 1 tablet by mouth daily. 15 billion=dosage    . raloxifene (EVISTA) 60 MG tablet Take 60 mg by mouth daily.    . SUPER B COMPLEX/C PO Take 1 tablet by mouth daily.     . Vitamin D, Ergocalciferol, (DRISDOL) 50000 units CAPS capsule Take 50,000 Units by mouth every 7 (seven) days. Saturdays     No current facility-administered medications for this visit.     Allergies:   Amoxicillin; Penicillins; Vortioxetine; Lisinopril; Metoprolol succinate er; and Propoxyphene    Social History:  The patient  reports that she quit smoking about 17 years ago. Her smoking use included Cigarettes. She has a 9.00 pack-year smoking history. She has never used smokeless tobacco. She reports that she drinks about 1.2 oz of alcohol per week . She reports that she does not use drugs.   Family History:  The patient's family history includes Alzheimer's disease in her mother; CAD in her father; Lung cancer in her maternal grandmother; Prostate cancer in her brother and father.    ROS:  Please see the history of present illness.   Otherwise, review of systems are positive for none.   All other systems are reviewed and negative.    PHYSICAL EXAM: VS:  BP 131/76   Pulse 97   Ht 5\' 2"  (1.575 m)   Wt 178 lb 9.6 oz (81 kg)   BMI 32.67 kg/m  , BMI Body mass index is 32.67 kg/m. GENERAL:  Well appearing HEENT:  Pupils equal round and reactive, fundi not visualized, oral mucosa unremarkable NECK:  No jugular venous distention, waveform within normal limits, carotid upstroke brisk and symmetric, no bruits LYMPHATICS:  No cervical  adenopathy LUNGS:  Clear to auscultation bilaterally HEART:  RRR.  PMI not displaced or sustained,S1 and S2 within normal limits, no S3, no S4, no clicks, no rubs, no murmurs ABD:  Flat, positive bowel sounds normal in frequency in pitch, no bruits, no rebound, no guarding, no midline pulsatile mass, no hepatomegaly, no splenomegaly EXT:  2 plus pulses throughout, no edema, no cyanosis no clubbing SKIN:  No rashes no nodules NEURO:  Cranial nerves II through XII grossly intact, motor grossly intact throughout PSYCH:  Cognitively intact, oriented to person place and time    EKG:  EKG is ordered today. The ekg ordered today demonstrates sinus rhythm  rate 86 bpm.  Exercise Myoview 05/08/16:  The left ventricular ejection fraction is mildly decreased (45-54%).  Nuclear stress EF: 48%.  There was no ST segment deviation noted during stress.  Defect 1: There is a large defect of moderate severity present in the basal anteroseptal, basal inferoseptal, mid anteroseptal, mid inferoseptal, apical septal and apex location.  Findings consistent with prior myocardial infarction.  This is an intermediate risk study.   There is a large size, moderate severity, irreversible defect in the basal and mid inferoseptal, anteroseptal and apical septal wall including the true septum consistent with a scar and no peri-infarct ischemia.    Recent Labs: 04/24/2016: ALT 23; BUN 18; Creatinine, Ser 0.77; Hemoglobin 13.6; Platelets 231; Potassium 4.3; Sodium 136    Lipid Panel No results found for: CHOL, TRIG, HDL, CHOLHDL, VLDL, LDLCALC, LDLDIRECT    Wt Readings from Last 3 Encounters:  05/14/16 178 lb 9.6 oz (81 kg)  05/08/16 177 lb (80.3 kg)  04/24/16 177 lb (80.3 kg)      ASSESSMENT AND PLAN:  # Abnromal stress:  # Chronic systolic and diastolic heart failure: Ms. Legleiter was noted to have reduced systolic function on echo, which was a surprise.  This was confirmed on her stress test.  She had  no evidence of ischemia, though there does appear to be an old scar.  It is unclear whether this is due to coronary disease or a non-ischemic process.  We will get a CT-A to better evaluate and to determine if she needs secondary prevention.  She is at low risk for surgery and can go ahead with her VATS.  We will also check lipids and a CMP.  # Hypertension: Blood pressure is well-controlled on hydrochlorothiazide and nebivolol.  No changes recommended at this time.  # Hyperlipidemia: Continue atorvastatin and check lipids and a CMP.    Current medicines are reviewed at length with the patient today.  The patient does not have concerns regarding medicines.  The following changes have been made:  no change  Labs/ tests ordered today include:   Orders Placed This Encounter  Procedures  . CT Heart Morp W/Cta Cor W/Score W/Ca W/Cm &/Or Wo/Cm  . Lipid panel  . Comprehensive metabolic panel     Disposition:   FU with See Beharry C. Oval Linsey, MD, [facility] in 6 months.    This note was written with the assistance of speech recognition software.  Please excuse any transcriptional errors.  Signed, Dezzie Badilla C. Oval Linsey, MD, [facility]  05/17/2016 7:18 PM    [place]

## 2016-05-17 ENCOUNTER — Encounter: Payer: Self-pay | Admitting: Cardiovascular Disease

## 2016-05-17 DIAGNOSIS — E785 Hyperlipidemia, unspecified: Secondary | ICD-10-CM

## 2016-05-17 DIAGNOSIS — I10 Essential (primary) hypertension: Secondary | ICD-10-CM

## 2016-05-17 DIAGNOSIS — I5043 Acute on chronic combined systolic (congestive) and diastolic (congestive) heart failure: Secondary | ICD-10-CM

## 2016-05-17 HISTORY — DX: Essential (primary) hypertension: I10

## 2016-05-17 HISTORY — DX: Acute on chronic combined systolic (congestive) and diastolic (congestive) heart failure: I50.43

## 2016-05-17 HISTORY — DX: Hyperlipidemia, unspecified: E78.5

## 2016-05-19 ENCOUNTER — Ambulatory Visit: Payer: Medicare Other | Admitting: Cardiovascular Disease

## 2016-05-22 ENCOUNTER — Encounter (HOSPITAL_COMMUNITY): Payer: Self-pay

## 2016-05-22 ENCOUNTER — Encounter (HOSPITAL_COMMUNITY)
Admission: RE | Admit: 2016-05-22 | Discharge: 2016-05-22 | Disposition: A | Discharge status: Home / Self Care | Payer: Medicare Other | Source: Ambulatory Visit | Attending: Cardiothoracic Surgery | Admitting: Cardiothoracic Surgery

## 2016-05-22 ENCOUNTER — Encounter (HOSPITAL_COMMUNITY): Admission: RE | Admit: 2016-05-22 | Payer: Medicare Other | Source: Ambulatory Visit

## 2016-05-22 DIAGNOSIS — J984 Other disorders of lung: Secondary | ICD-10-CM | POA: Insufficient documentation

## 2016-05-22 HISTORY — DX: Anemia, unspecified: D64.9

## 2016-05-22 HISTORY — DX: Acute myocardial infarction, unspecified: I21.9

## 2016-05-22 LAB — URINE MICROSCOPIC-ADD ON

## 2016-05-22 LAB — BLOOD GAS, ARTERIAL
Acid-Base Excess: 1.5 mmol/L (ref 0.0–2.0)
Bicarbonate: 24.6 mmol/L (ref 20.0–28.0)
Drawn by: 206361
FIO2: 0.21
O2 Saturation: 96.1 %
Patient temperature: 98.6
pCO2 arterial: 32.1 mmHg (ref 32.0–48.0)
pH, Arterial: 7.496 — ABNORMAL HIGH (ref 7.350–7.450)
pO2, Arterial: 80.9 mmHg — ABNORMAL LOW (ref 83.0–108.0)

## 2016-05-22 LAB — COMPREHENSIVE METABOLIC PANEL
ALT: 27 U/L (ref 14–54)
AST: 37 U/L (ref 15–41)
Albumin: 4.1 g/dL (ref 3.5–5.0)
Alkaline Phosphatase: 55 U/L (ref 38–126)
Anion gap: 9 (ref 5–15)
BUN: 15 mg/dL (ref 6–20)
CO2: 22 mmol/L (ref 22–32)
Calcium: 10.2 mg/dL (ref 8.9–10.3)
Chloride: 107 mmol/L (ref 101–111)
Creatinine, Ser: 0.8 mg/dL (ref 0.44–1.00)
GFR calc Af Amer: >60 mL/min (ref >60)
GFR calc non Af Amer: >60 mL/min (ref >60)
Glucose, Bld: 100 mg/dL — ABNORMAL HIGH (ref 65–99)
Potassium: 4.2 mmol/L (ref 3.5–5.1)
Sodium: 138 mmol/L (ref 135–145)
Total Bilirubin: 0.6 mg/dL (ref 0.3–1.2)
Total Protein: 6.8 g/dL (ref 6.5–8.1)

## 2016-05-22 LAB — CBC
HCT: 40 % (ref 36.0–46.0)
Hemoglobin: 13.4 g/dL (ref 12.0–15.0)
MCH: 32 pg (ref 26.0–34.0)
MCHC: 33.5 g/dL (ref 30.0–36.0)
MCV: 95.5 fL (ref 78.0–100.0)
Platelets: 227 10*3/uL (ref 150–400)
RBC: 4.19 MIL/uL (ref 3.87–5.11)
RDW: 12.9 % (ref 11.5–15.5)
WBC: 7.2 10*3/uL (ref 4.0–10.5)

## 2016-05-22 LAB — URINALYSIS, ROUTINE W REFLEX MICROSCOPIC
Bilirubin Urine: NEGATIVE
Glucose, UA: NEGATIVE mg/dL
Hgb urine dipstick: NEGATIVE
Ketones, ur: NEGATIVE mg/dL
Nitrite: NEGATIVE
Protein, ur: NEGATIVE mg/dL
Specific Gravity, Urine: 1.015 (ref 1.005–1.030)
pH: 6.5 (ref 5.0–8.0)

## 2016-05-22 LAB — APTT: aPTT: 29 seconds (ref 24–36)

## 2016-05-22 LAB — PROTIME-INR
INR: 0.95
Prothrombin Time: 12.7 seconds (ref 11.4–15.2)

## 2016-05-22 LAB — SURGICAL PCR SCREEN
MRSA, PCR: NEGATIVE
STAPHYLOCOCCUS AUREUS: NEGATIVE

## 2016-05-22 NOTE — Pre-Procedure Instructions (Addendum)
Rose Dunn  05/22/2016      [facility] - [place], [place] AT [facility] OF [place] [address] [place] [address] Phone: [phone] Fax: [phone]    Your procedure is scheduled on 2016-05-31  Report to [facility] Admitting at 530 A.M.  Call this number if you have problems the morning of surgery:  [phone]   Remember:  Do not eat food or drink liquids after midnight.  Take these medicines the morning of surgery with A SIP OF WATER albuterol inhaler(bring),pristiq, levothyroxine,nebivolol, protonix  STOP all herbel meds, nsaids (aleve,naproxen,advil,ibuprofen)  starting TODAY including all vitamins,  Aspirin per dr   Lazaro Arms not wear jewelry, make-up or nail polish.  Do not wear lotions, powders, or perfumes, or deoderant.  Do not shave 48 hours prior to surgery.  Men may shave face and neck.  Do not bring valuables to the hospital.  [facility] is not responsible for any belongings or valuables.  Contacts, dentures or bridgework may not be worn into surgery.  Leave your suitcase in the car.  After surgery it may be brought to your room.  For patients admitted to the hospital, discharge time will be determined by your treatment team.  Patients discharged the day of surgery will not be allowed to drive home.   Name and phone number of your driver:   Special instructions:   Special Instructions: [facility] - Preparing for Surgery  Before surgery, you can play an important role.  Because skin is not sterile, your skin needs to be as free of germs as possible.  You can reduce the number of germs on you skin by washing with CHG (chlorahexidine gluconate) soap before surgery.  CHG is an antiseptic cleaner which kills germs and bonds with the skin to continue killing germs even after washing.  Please DO NOT use if you have an allergy to CHG or antibacterial soaps.  If your skin becomes  reddened/irritated stop using the CHG and inform your nurse when you arrive at Short Stay.  Do not shave (including legs and underarms) for at least 48 hours prior to the first CHG shower.  You may shave your face.  Please follow these instructions carefully:   1.  Shower with CHG Soap the night before surgery and the morning of Surgery.  2.  If you choose to wash your hair, wash your hair first as usual with your normal shampoo.  3.  After you shampoo, rinse your hair and body thoroughly to remove the Shampoo.  4.  Use CHG as you would any other liquid soap.  You can apply chg directly  to the skin and wash gently with scrungie or a clean washcloth.  5.  Apply the CHG Soap to your body ONLY FROM THE NECK DOWN.  Do not use on open wounds or open sores.  Avoid contact with your eyes ears, mouth and genitals (private parts).  Wash genitals (private parts)       with your normal soap.  6.  Wash thoroughly, paying special attention to the area where your surgery will be performed.  7.  Thoroughly rinse your body with warm water from the neck down.  8.  DO NOT shower/wash with your normal soap after using and rinsing off the CHG Soap.  9.  Pat yourself dry with a clean towel.            10.  Wear clean pajamas.            11.  Place clean sheets on your bed the night of your first shower and do not sleep with pets.  Day of Surgery  Do not apply any lotions/deodorants the morning of surgery.  Please wear clean clothes to the hospital/surgery center.  Please read over the  fact sheets that you were given.

## 2016-05-25 ENCOUNTER — Ambulatory Visit (INDEPENDENT_AMBULATORY_CARE_PROVIDER_SITE_OTHER): Payer: Medicare Other | Admitting: Cardiothoracic Surgery

## 2016-05-25 VITALS — BP 119/77 | HR 92 | Resp 16

## 2016-05-25 DIAGNOSIS — J984 Other disorders of lung: Secondary | ICD-10-CM

## 2016-05-25 NOTE — Progress Notes (Signed)
Anesthesia Follow-up: See anesthesia note by Willeen Cass, FNP-BC from 04/27/16. Patient was scheduled for video bronchoscopy with endobronchial ultrasound, right VATS, lobectomy for RUL mass on 04/28/16, but she had an abnormal echo (reduced systolic function) and procedure was postponed to allow further work-up. Stress test showed scar (consistent with prior MI) but no ischemia. She was ultimately cleared by Dr. Oval Linsey with low risk.   - PCP is Dr. Harlan Stains. - Cardiologist is Dr. Skeet Latch, last visit 05/14/16. Six month follow-up planned with cardiac CT planned prior to that appointment. - GI is Dr. Michail Sermon.  Meds include albuterol, aspirin 81 mg, Lipitor, Pristiq, folic acid, HCTZ, Synthroid (brand only), Ativan, Bystolic, Protonix.  BP 129/67   Pulse 75   Temp 36.7 C (Oral)   Resp 18   Ht 5\' 2"  (1.575 m)   Wt 174 lb 7 oz (79.1 kg)   SpO2 95%   BMI 31.91 kg/m   Exercise Myoview 05/08/16:  The left ventricular ejection fraction is mildly decreased (45-54%).  Nuclear stress EF: 48%.  There was no ST segment deviation noted during stress.  Defect 1: There is a large defect of moderate severity present in the basal anteroseptal, basal inferoseptal, mid anteroseptal, mid inferoseptal, apical septal and apex location.  Findings consistent with prior myocardial infarction.  This is an intermediate risk study. There is a large size, moderate severity, irreversible defect in the basal and mid inferoseptal, anteroseptal and apical septal wall including the true septum consistent with a scar and no peri-infarct ischemia.   Echo 04/22/16: Impressions: - LVEF 40-45%, inferior and septal severe hypokinesis to akinesis,   normal wall thickness, diastolic dysfunction with elevated LV   filling pressure, trace to mild MR, mild LAE, trivial TR, RVSP 29   mmHg, normal IVC.  05/22/16 EKG: NSR, possible LAE, left BBB.   05/22/16 CXR: IMPRESSION: Right upper lobe scarring or  atelectasis.  No change.  03/12/16 Spirometry: > 90% FEV1, 92% FVC and 62% diffusion capacity.  Preoperative labs noted.   If no acute changes then I anticipate that she can proceed as planned.  George Hugh [facility] Short Stay Center/Anesthesiology Phone [phone] 05/25/2016 10:27 AM

## 2016-05-25 NOTE — Progress Notes (Signed)
PCP is Vidal Schwalbe, MD Referring Provider is Curt Bears, MD  Chief Complaint  Patient presents with  . Follow-up    to assess recent dx of C-DIFF and results of treatment in prep for scheduled surgery  Patient presents for final discussion and exam prior to bronchoscopy, transbronchial ultrasound-guided lymph node biopsy and right VATS with right upper lobectomy  HPI: 75 year old Caucasian female reformed smoker returns for final discussion of bronchoscopy and then combined right VATS right upper lobectomy. 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 Bronchoscopy by her pulmonologist at [facility]  demonstrated the mass but biopsy was Nondiagnostic. 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 PET scan showed mild [id number] SUV] metabolic activity in the primary mass. There is a 1.4 mm right paratracheal lymph node with mild SUV uptake as well, 3.2 SUV. Patient's PFTs and diffusion capacity are close to normal.   Patient's surgery was initially rescheduled after preoperative echocardiogram demonstrated ejection fraction of 35%. Follow-up Myoview scan showed a thick scar without evidence of coronary malperfusion.  Preoperative brain MRI was negative.  More recently the patient was evaluated by her gastroenterologist for loose stools. Screening for C. difficile was sent. One sample is negative. One sample was positive. She finished a course of oral vancomycin which was chosen over Flagyl because of her habit of 1-2 cocktails each evening. She denies cramping abdominal discomfort significant weight loss or weakness. The vancomycin firmed up her stools. In speaking with the patient is evident she has had cycles of loose stools-diarrhea over years and may have a irritable bowel syndrome as well.   The patient has not smoked in over 25 years.  She usually drinks 2 Manhattan's each evening.  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  Past Medical History:  Diagnosis Date  . Acute on chronic systolic and diastolic heart failure, NYHA class 1 ([place]) 05/17/2016  . Anemia    hx  . Anxiety   . Atelectasis of right lung   . Cancer ([place])    skin cancer - squamous - face,, leg  . COPD (chronic obstructive pulmonary disease) ([place])   . Depression   . Essential hypertension 05/17/2016  . GERD (gastroesophageal reflux disease)   . Headache    Migrine- no longer   . History of blood transfusion   . Hyperlipidemia 05/17/2016  . Hypertension   . Hypothyroidism   . Lung mass   . Myocardial infarction    hx  . Osteoarthritis   . Osteoporosis   . Pneumonia 2017  . Rib fractures     Past Surgical History:  Procedure Laterality Date  . COLONOSCOPY    . EXPLORATORY LAPAROTOMY  1969   ectopic preg  . EYE SURGERY Bilateral 2017   Cataract with lens- Right 11/04/15, Left 11/25/15  . HYSTERECTOMY ABDOMINAL WITH SALPINGECTOMY     oophorectomy also  . JOINT REPLACEMENT Right 2009   hip  . THYROIDECTOMY, PARTIAL  1976    Family History  Problem Relation Age of Onset  . Alzheimer's disease Mother   . Prostate cancer Father   . CAD Father   . Prostate cancer Brother   . Lung cancer Maternal Grandmother     Social History Social History  Substance Use Topics  . Smoking status: Former Smoker    Packs/day: 1.00    Years: 9.00    Types: Cigarettes  Quit date: 03/12/1999  . Smokeless tobacco: Never Used  . Alcohol use 1.2 oz/week    2 Shots of liquor per week     Comment: 1-2 DRINKS/DAY stopped 1 week ago    Current Outpatient Prescriptions  Medication Sig Dispense Refill  . albuterol (PROVENTIL HFA;VENTOLIN HFA) 108 (90 Base) MCG/ACT inhaler Inhale 2 puffs into the lungs every 4 (four) hours as needed for wheezing or shortness of breath.    . Ascorbic Acid (VITAMIN C) 1000 MG tablet Take 1,000 mg by mouth daily.    Marland Kitchen aspirin EC 81 MG tablet Take 81 mg by mouth  daily.    Marland Kitchen atorvastatin (LIPITOR) 80 MG tablet Take 80 mg by mouth daily.    Marland Kitchen BIOTIN 5000 PO Take 1 tablet by mouth daily.    . Calcium Carbonate-Vitamin D (CALCIUM 600+D) 600-200 MG-UNIT TABS Take 1 tablet by mouth 2 (two) times daily.    Marland Kitchen desvenlafaxine (PRISTIQ) 100 MG 24 hr tablet Take 100 mg by mouth daily.    Marland Kitchen FOLIC ACID PO Take A999333 mcg by mouth.    . hydrochlorothiazide (HYDRODIURIL) 25 MG tablet Take 25 mg by mouth daily.    Marland Kitchen levothyroxine (SYNTHROID, LEVOTHROID) 125 MCG tablet Take 125 mcg by mouth daily before breakfast.    . LORazepam (ATIVAN) 0.5 MG tablet Take 0.5 mg by mouth as needed for anxiety.    . Multiple Vitamins-Minerals (MULTIVITAMIN ADULT PO) Take 1 tablet by mouth daily.    . nebivolol (BYSTOLIC) 5 MG tablet Take 5 mg by mouth daily.    . pantoprazole (PROTONIX) 40 MG tablet Take 40 mg by mouth 1 day or 1 dose.     . Probiotic Product (SOLUBLE FIBER/PROBIOTICS PO) Take 1 tablet by mouth daily. 15 billion=dosage    . raloxifene (EVISTA) 60 MG tablet Take 60 mg by mouth daily.    . SUPER B COMPLEX/C PO Take 1 tablet by mouth daily.     . Vitamin D, Ergocalciferol, (DRISDOL) 50000 units CAPS capsule Take 50,000 Units by mouth every 7 (seven) days. Saturdays     No current facility-administered medications for this visit.     Allergies  Allergen Reactions  . Amoxicillin Anaphylaxis    Has patient had a PCN reaction causing immediate rash, facial/tongue/throat swelling, SOB or lightheadedness with hypotension: Yes Has patient had a PCN reaction causing severe rash involving mucus membranes or skin necrosis: Yes Has patient had a PCN reaction that required hospitalization No Has patient had a PCN reaction occurring within the last 10 years: Yes If all of the above answers are "NO", then may proceed with Cephalosporin use.   Marland Kitchen Penicillins Anaphylaxis    Has patient had a PCN reaction causing immediate rash, facial/tongue/throat swelling, SOB or lightheadedness  with hypotension: Yes Has patient had a PCN reaction causing severe rash involving mucus membranes or skin necrosis: Yes Has patient had a PCN reaction that required hospitalization No Has patient had a PCN reaction occurring within the last 10 years: Yes If all of the above answers are "NO", then may proceed with Cephalosporin use.   . Vortioxetine Swelling    Worse depression, achy, edema  . Lisinopril Other (See Comments)    UNSPECIFIED REACTION   . Metoprolol Succinate Er Other (See Comments)    UNSPECIFIED REACTION   . Propoxyphene Other (See Comments)    UNSPECIFIED REACTION     Review of Systems  ROS         Review of Systems :  [  y ] = yes, [  ] = no        General :  Weight gain [   ]    Weight loss  [ mild  ]  F no atigue [  ]  Fever [  ]  Chills  [  ]                                Weakness  [ ]            HEENT    Headache [  ]  Dizziness [  ]  Blurred vision [  ] Glaucoma  [  ]                          Nosebleeds [  ] Painful or loose teeth [  ]        Cardiac :  Chest pain/ pressure [  ]  Resting SOB [  ] exertional SOB [  ]                        Orthopnea [  ]  Pedal edema  [  ]  Palpitations [  ] Syncope/presyncope [ ]                         Paroxysmal nocturnal dyspnea [  ]         Pulmonary : cough [ yes ]  wheezing [  ]  Hemoptysis [  ] Sputum [  ] Snoring [  ]                              Pneumothorax [  ]  Sleep apnea [  ]        GI : Vomiting [  ]  Dysphagia [  ]  Melena  [  ]  Abdominal pain [  ] BRBPR [  ]              Heart burn [  ]  Constipation [  ] Diarrhea  [place].Blacker  ] Colonoscopy [  yes ]        GU : Hematuria [  ]  Dysuria [  ]  Nocturia [  ] UTI's [  ]        Vascular : Claudication [  ]  Rest pain [  ]  DVT [  ] Vein stripping [  ] leg ulcers [  ]                          TIA [  ] Stroke [  ]  Varicose veins [  ]        NEURO :  Headaches  [  ] Seizures [  ] Vision changes [  ] Paresthesias [  ]                                       Seizures [   ]        Musculoskeletal :  Arthritis [mild] Gout  [  ]  Back pain [  ]  Joint pain [  ]  Skin :  Rash [  ]  Melanoma [  ] Sores [  ]        Heme : Bleeding problems [  ]Clotting Disorders [  ] Anemia [  ]Blood Transfusion [ ]         Endocrine : Diabetes [  ] Heat or Cold intolerance [  ] Polyuria [  ]excessive thirst [ ]         Psych : Depression [ yes on meds ]  Anxiety [  ]  Psych hospitalizations [  ] Memory change [mild  ]                                                 BP 119/77   Pulse 92   Resp 16   SpO2 98% Comment: ON RA saturation after walking on room air 96%  Physical Exam      Exam    General- alert and comfortable   HEENT normocephalic pupils equal dentition adequate Neck without JVD mass or bruit   Lungs- clear without rales, wheezes   Cor- regular rate and rhythm, no murmur , gallop   Abdomen- soft, non-tender normal bowel sounds   Extremities - warm, non-tender, minimal edema   Neuro- oriented, appropriate, no focal weakness    Skin clean dry without rash or lesions    Diagnostic Tests:  CT scan shows a 2 cm mass obstructing the anterior segment of the right upper lobe bronchus. There is distal atelectasis.   there is a less than 1.5 cm right paratracheal node with mild activity on PET scan.  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 Plan bronchoscopy possible endobronchial biopsy transbronchial ultrasound-guided biopsy of mediastinal node and then probable right VATS with right upper lobectomy. I discussed the benefits and risks of lobectomy with the patient including the risks of bleeding, blood transfusion, prolonged air leak, postoperative pneumonia, stroke MI and death. She understands and agrees to proceed with surgery.  Plan:Proceed with surgery in a.m.    Len Childs, MD [facility] [phone]

## 2016-05-26 ENCOUNTER — Inpatient Hospital Stay (HOSPITAL_COMMUNITY): Payer: Medicare Other | Admitting: Anesthesiology

## 2016-05-26 ENCOUNTER — Inpatient Hospital Stay (HOSPITAL_COMMUNITY)
Admission: RE | Admit: 2016-05-26 | Discharge: 2016-06-17 | DRG: 167 | Disposition: E | Payer: Medicare Other | Source: Ambulatory Visit | Attending: Cardiothoracic Surgery | Admitting: Cardiothoracic Surgery

## 2016-05-26 ENCOUNTER — Encounter (HOSPITAL_COMMUNITY): Payer: Self-pay | Admitting: *Deleted

## 2016-05-26 ENCOUNTER — Encounter (HOSPITAL_COMMUNITY): Admission: RE | Disposition: E | Payer: Self-pay | Source: Ambulatory Visit | Attending: Cardiothoracic Surgery

## 2016-05-26 ENCOUNTER — Inpatient Hospital Stay (HOSPITAL_COMMUNITY): Payer: Medicare Other | Admitting: Vascular Surgery

## 2016-05-26 DIAGNOSIS — D491 Neoplasm of unspecified behavior of respiratory system: Principal | ICD-10-CM | POA: Diagnosis present

## 2016-05-26 DIAGNOSIS — J9811 Atelectasis: Secondary | ICD-10-CM | POA: Diagnosis present

## 2016-05-26 DIAGNOSIS — F419 Anxiety disorder, unspecified: Secondary | ICD-10-CM | POA: Diagnosis present

## 2016-05-26 DIAGNOSIS — E785 Hyperlipidemia, unspecified: Secondary | ICD-10-CM | POA: Diagnosis present

## 2016-05-26 DIAGNOSIS — I110 Hypertensive heart disease with heart failure: Secondary | ICD-10-CM | POA: Diagnosis present

## 2016-05-26 DIAGNOSIS — Z5332 Thoracoscopic surgical procedure converted to open procedure: Secondary | ICD-10-CM | POA: Diagnosis not present

## 2016-05-26 DIAGNOSIS — Z87891 Personal history of nicotine dependence: Secondary | ICD-10-CM

## 2016-05-26 DIAGNOSIS — K219 Gastro-esophageal reflux disease without esophagitis: Secondary | ICD-10-CM | POA: Diagnosis present

## 2016-05-26 DIAGNOSIS — R911 Solitary pulmonary nodule: Secondary | ICD-10-CM | POA: Diagnosis not present

## 2016-05-26 DIAGNOSIS — I252 Old myocardial infarction: Secondary | ICD-10-CM

## 2016-05-26 DIAGNOSIS — I5042 Chronic combined systolic (congestive) and diastolic (congestive) heart failure: Secondary | ICD-10-CM | POA: Diagnosis present

## 2016-05-26 DIAGNOSIS — F329 Major depressive disorder, single episode, unspecified: Secondary | ICD-10-CM | POA: Diagnosis present

## 2016-05-26 DIAGNOSIS — J984 Other disorders of lung: Secondary | ICD-10-CM

## 2016-05-26 HISTORY — PX: THORACOTOMY: SHX5074

## 2016-05-26 HISTORY — PX: VIDEO BRONCHOSCOPY WITH ENDOBRONCHIAL ULTRASOUND: SHX6177

## 2016-05-26 LAB — POCT I-STAT 7, (LYTES, BLD GAS, ICA,H+H)
Acid-base deficit: 5 mmol/L — ABNORMAL HIGH (ref 0.0–2.0)
Bicarbonate: 22.1 mmol/L (ref 20.0–28.0)
Calcium, Ion: 1.68 mmol/L (ref 1.15–1.40)
HCT: 17 % — ABNORMAL LOW (ref 36.0–46.0)
Hemoglobin: 5.8 g/dL — CL (ref 12.0–15.0)
O2 Saturation: 100 %
Patient temperature: 33.8
Potassium: 5.4 mmol/L — ABNORMAL HIGH (ref 3.5–5.1)
Sodium: 147 mmol/L — ABNORMAL HIGH (ref 135–145)
TCO2: 24 mmol/L (ref 0–100)
pCO2 arterial: 45.9 mmHg (ref 32.0–48.0)
pH, Arterial: 7.273 — ABNORMAL LOW (ref 7.350–7.450)
pO2, Arterial: 355 mmHg — ABNORMAL HIGH (ref 83.0–108.0)

## 2016-05-26 LAB — PROTIME-INR
INR: 1.99
PROTHROMBIN TIME: 22.9 s — AB (ref 11.4–15.2)

## 2016-05-26 LAB — CBC
HCT: 23.9 % — ABNORMAL LOW (ref 36.0–46.0)
HEMOGLOBIN: 7.9 g/dL — AB (ref 12.0–15.0)
MCH: 30.2 pg (ref 26.0–34.0)
MCHC: 33.1 g/dL (ref 30.0–36.0)
MCV: 91.2 fL (ref 78.0–100.0)
Platelets: 49 10*3/uL — ABNORMAL LOW (ref 150–400)
RBC: 2.62 MIL/uL — AB (ref 3.87–5.11)
RDW: 15.2 % (ref 11.5–15.5)
WBC: 4.4 10*3/uL (ref 4.0–10.5)

## 2016-05-26 LAB — COMPREHENSIVE METABOLIC PANEL
ALK PHOS: 16 U/L — AB (ref 38–126)
ALT: 10 U/L — ABNORMAL LOW (ref 14–54)
AST: 14 U/L — AB (ref 15–41)
Albumin: 1.5 g/dL — ABNORMAL LOW (ref 3.5–5.0)
Anion gap: 3 — ABNORMAL LOW (ref 5–15)
BILIRUBIN TOTAL: 0.5 mg/dL (ref 0.3–1.2)
BUN: 10 mg/dL (ref 6–20)
CALCIUM: 11 mg/dL — AB (ref 8.9–10.3)
CO2: 23 mmol/L (ref 22–32)
Chloride: 120 mmol/L — ABNORMAL HIGH (ref 101–111)
Creatinine, Ser: 0.58 mg/dL (ref 0.44–1.00)
GFR calc Af Amer: >60 mL/min (ref >60)
GLUCOSE: 314 mg/dL — AB (ref 65–99)
POTASSIUM: 5.5 mmol/L — AB (ref 3.5–5.1)
Sodium: 146 mmol/L — ABNORMAL HIGH (ref 135–145)

## 2016-05-26 LAB — BLOOD PRODUCT ORDER (VERBAL) VERIFICATION

## 2016-05-26 LAB — FIBRINOGEN: Fibrinogen: 95 mg/dL — CL (ref 210–475)

## 2016-05-26 LAB — MASSIVE TRANSFUSION PROTOCOL ORDER (BLOOD BANK NOTIFICATION)

## 2016-05-26 LAB — APTT: aPTT: 50 seconds — ABNORMAL HIGH (ref 24–36)

## 2016-05-26 SURGERY — BRONCHOSCOPY, WITH EBUS
Anesthesia: General | Site: Chest | Laterality: Right

## 2016-05-26 MED ORDER — PROPOFOL 10 MG/ML IV BOLUS
INTRAVENOUS | Status: DC | PRN
  Administered 2016-05-26: 150 mg via INTRAVENOUS

## 2016-05-26 MED ORDER — CALCIUM CHLORIDE 10 % IV SOLN
INTRAVENOUS | Status: DC | PRN
  Administered 2016-05-26 (×4): 1 g via INTRAVENOUS

## 2016-05-26 MED ORDER — VASOPRESSIN 20 UNIT/ML IV SOLN
0.0300 [IU]/min | INTRAVENOUS | Status: DC
  Filled 2016-05-26: qty 2

## 2016-05-26 MED ORDER — ROCURONIUM BROMIDE 10 MG/ML (PF) SYRINGE
PREFILLED_SYRINGE | INTRAVENOUS | Status: AC
  Filled 2016-05-26: qty 10

## 2016-05-26 MED ORDER — EPINEPHRINE PF 1 MG/ML IJ SOLN
0.5000 ug/min | INTRAVENOUS | Status: DC
  Filled 2016-05-26: qty 4

## 2016-05-26 MED ORDER — NOREPINEPHRINE BITARTRATE 1 MG/ML IV SOLN
0.0000 ug/min | INTRAVENOUS | Status: DC
  Filled 2016-05-26: qty 4

## 2016-05-26 MED ORDER — FENTANYL CITRATE (PF) 250 MCG/5ML IJ SOLN
INTRAMUSCULAR | Status: AC
  Filled 2016-05-26: qty 10

## 2016-05-26 MED ORDER — EPINEPHRINE PF 1 MG/ML IJ SOLN
INTRAMUSCULAR | Status: AC
  Filled 2016-05-26: qty 1

## 2016-05-26 MED ORDER — EPINEPHRINE PF 1 MG/ML IJ SOLN: 0.5000 ug/min | INTRAVENOUS | Status: DC

## 2016-05-26 MED ORDER — EPINEPHRINE PF 1 MG/ML IJ SOLN
INTRAVENOUS | Status: DC | PRN
  Administered 2016-05-26: 10 ug/min via INTRAVENOUS

## 2016-05-26 MED ORDER — EPINEPHRINE PF 1 MG/ML IJ SOLN
INTRAMUSCULAR | Status: AC
  Filled 2016-05-26: qty 2

## 2016-05-26 MED ORDER — 0.9 % SODIUM CHLORIDE (POUR BTL) OPTIME
TOPICAL | Status: DC | PRN
  Administered 2016-05-26: 3000 mL

## 2016-05-26 MED ORDER — AMIODARONE HCL IN DEXTROSE 360-4.14 MG/200ML-% IV SOLN
60.0000 mg/h | INTRAVENOUS | Status: AC
  Administered 2016-05-26: 60 mg/h via INTRAVENOUS
  Filled 2016-05-26: qty 200

## 2016-05-26 MED ORDER — DEXAMETHASONE SODIUM PHOSPHATE 10 MG/ML IJ SOLN
INTRAMUSCULAR | Status: AC
  Filled 2016-05-26: qty 1

## 2016-05-26 MED ORDER — AMIODARONE HCL IN DEXTROSE 360-4.14 MG/200ML-% IV SOLN
60.0000 mg/h | INTRAVENOUS | Status: DC
  Filled 2016-05-26: qty 200

## 2016-05-26 MED ORDER — DEXTROSE 5 % IV SOLN
INTRAVENOUS | Status: DC | PRN
  Administered 2016-05-26: 20 ug/kg/min via INTRAVENOUS

## 2016-05-26 MED ORDER — FENTANYL CITRATE (PF) 100 MCG/2ML IJ SOLN
INTRAMUSCULAR | Status: DC | PRN
  Administered 2016-05-26 (×8): 50 ug via INTRAVENOUS

## 2016-05-26 MED ORDER — EPHEDRINE 5 MG/ML INJ
INTRAVENOUS | Status: AC
  Filled 2016-05-26: qty 10

## 2016-05-26 MED ORDER — DIPHENHYDRAMINE HCL 50 MG/ML IJ SOLN
INTRAMUSCULAR | Status: DC | PRN
  Administered 2016-05-26: 25 mg via INTRAVENOUS

## 2016-05-26 MED ORDER — SODIUM CHLORIDE 0.9 % IV SOLN
INTRAVENOUS | Status: DC | PRN
  Administered 2016-05-26: 60 mL via INTRAMUSCULAR

## 2016-05-26 MED ORDER — PHENYLEPHRINE HCL 10 MG/ML IJ SOLN
INTRAVENOUS | Status: DC | PRN
  Administered 2016-05-26: 25 ug/min via INTRAVENOUS

## 2016-05-26 MED ORDER — VASOPRESSIN 20 UNIT/ML IV SOLN
INTRAVENOUS | Status: DC | PRN
  Administered 2016-05-26 (×2): 10 [IU] via INTRAVENOUS
  Administered 2016-05-26 (×2): 5 [IU] via INTRAVENOUS
  Administered 2016-05-26: 10 [IU] via INTRAVENOUS

## 2016-05-26 MED ORDER — ROCURONIUM BROMIDE 100 MG/10ML IV SOLN
INTRAVENOUS | Status: DC | PRN
  Administered 2016-05-26: 70 mg via INTRAVENOUS
  Administered 2016-05-26: 50 mg via INTRAVENOUS
  Administered 2016-05-26 (×2): 30 mg via INTRAVENOUS
  Administered 2016-05-26: 50 mg via INTRAVENOUS

## 2016-05-26 MED ORDER — VASOPRESSIN 20 UNIT/ML IV SOLN
INTRAVENOUS | Status: DC | PRN
  Administered 2016-05-26: .04 [IU]/min via INTRAVENOUS

## 2016-05-26 MED ORDER — VANCOMYCIN HCL IN DEXTROSE 1-5 GM/200ML-% IV SOLN
1000.0000 mg | INTRAVENOUS | Status: AC
  Administered 2016-05-26: 1000 mg via INTRAVENOUS

## 2016-05-26 MED ORDER — DIPHENHYDRAMINE HCL 50 MG/ML IJ SOLN
INTRAMUSCULAR | Status: AC
  Filled 2016-05-26: qty 1

## 2016-05-26 MED ORDER — EPHEDRINE SULFATE 50 MG/ML IJ SOLN
INTRAMUSCULAR | Status: DC | PRN
  Administered 2016-05-26: 1 mg via INTRAVENOUS
  Administered 2016-05-26: 10 mg via INTRAVENOUS

## 2016-05-26 MED ORDER — LIDOCAINE IN D5W 4-5 MG/ML-% IV SOLN
INTRAVENOUS | Status: DC | PRN
  Administered 2016-05-26: 1.5 mg/min via INTRAVENOUS

## 2016-05-26 MED ORDER — BUPIVACAINE HCL (PF) 0.5 % IJ SOLN: INTRAMUSCULAR | Status: DC | PRN

## 2016-05-26 MED ORDER — ALBUMIN HUMAN 5 % IV SOLN
INTRAVENOUS | Status: DC | PRN
  Administered 2016-05-26 (×5): via INTRAVENOUS

## 2016-05-26 MED ORDER — MIDAZOLAM HCL 2 MG/2ML IJ SOLN
INTRAMUSCULAR | Status: AC
  Filled 2016-05-26: qty 2

## 2016-05-26 MED ORDER — DEXAMETHASONE SODIUM PHOSPHATE 10 MG/ML IJ SOLN
INTRAMUSCULAR | Status: DC | PRN
  Administered 2016-05-26: 10 mg via INTRAVENOUS

## 2016-05-26 MED ORDER — VANCOMYCIN HCL IN DEXTROSE 1-5 GM/200ML-% IV SOLN
INTRAVENOUS | Status: AC
  Filled 2016-05-26: qty 200

## 2016-05-26 MED ORDER — BUPIVACAINE HCL (PF) 0.5 % IJ SOLN
INTRAMUSCULAR | Status: AC
  Filled 2016-05-26: qty 30

## 2016-05-26 MED ORDER — GLYCOPYRROLATE 0.2 MG/ML IJ SOLN
INTRAMUSCULAR | Status: DC | PRN
  Administered 2016-05-26: 0.1 mg via INTRAVENOUS
  Administered 2016-05-26: 0.2 mg via INTRAVENOUS

## 2016-05-26 MED ORDER — PROPOFOL 10 MG/ML IV BOLUS
INTRAVENOUS | Status: AC
  Filled 2016-05-26: qty 20

## 2016-05-26 MED ORDER — SODIUM CHLORIDE 0.9 % IV SOLN
INTRAVENOUS | Status: DC | PRN
  Administered 2016-05-26: 10:00:00 via INTRAVENOUS

## 2016-05-26 MED ORDER — SODIUM BICARBONATE 8.4 % IV SOLN
INTRAVENOUS | Status: DC | PRN
  Administered 2016-05-26 (×7): 50 meq via INTRAVENOUS

## 2016-05-26 MED ORDER — SODIUM CHLORIDE 0.9 % IJ SOLN
INTRAMUSCULAR | Status: DC | PRN
  Administered 2016-05-26 (×2): 10 mL

## 2016-05-26 MED ORDER — BUPIVACAINE 0.5 % ON-Q PUMP SINGLE CATH 400 ML
400.0000 mL | INJECTION | Status: DC
  Filled 2016-05-26: qty 400

## 2016-05-26 MED ORDER — AMIODARONE IV BOLUS ONLY 150 MG/100ML
INTRAVENOUS | Status: DC | PRN
  Administered 2016-05-26 (×2): 150 mg via INTRAVENOUS

## 2016-05-26 MED ORDER — EPINEPHRINE PF 1 MG/ML IJ SOLN
INTRAMUSCULAR | Status: DC | PRN
  Administered 2016-05-26 (×10): 1 mg via INTRAVENOUS

## 2016-05-26 MED ORDER — LIDOCAINE HCL (CARDIAC) 20 MG/ML IV SOLN
INTRAVENOUS | Status: DC | PRN
  Administered 2016-05-26 (×3): 100 mg via INTRAVENOUS

## 2016-05-26 MED ORDER — PHENYLEPHRINE HCL 10 MG/ML IJ SOLN
INTRAMUSCULAR | Status: DC | PRN
  Administered 2016-05-26: 120 ug via INTRAVENOUS

## 2016-05-26 MED ORDER — GLYCOPYRROLATE 0.2 MG/ML IV SOSY
PREFILLED_SYRINGE | INTRAVENOUS | Status: AC
  Filled 2016-05-26: qty 3

## 2016-05-26 MED ORDER — KETAMINE HCL-SODIUM CHLORIDE 100-0.9 MG/10ML-% IV SOSY
PREFILLED_SYRINGE | INTRAVENOUS | Status: AC
  Filled 2016-05-26: qty 10

## 2016-05-26 MED ORDER — LACTATED RINGERS IV SOLN
INTRAVENOUS | Status: DC | PRN
  Administered 2016-05-26: 07:00:00 via INTRAVENOUS

## 2016-05-26 MED ORDER — LIDOCAINE 2% (20 MG/ML) 5 ML SYRINGE
INTRAMUSCULAR | Status: AC
  Filled 2016-05-26: qty 5

## 2016-05-26 MED FILL — Sodium Chloride IV Soln 0.9%: INTRAVENOUS | Qty: 6000 | Status: AC

## 2016-05-26 MED FILL — Heparin Sodium (Porcine) Inj 1000 Unit/ML: INTRAMUSCULAR | Qty: 60 | Status: AC

## 2016-05-26 SURGICAL SUPPLY — 106 items
APPLICATOR TIP EXT COSEAL (VASCULAR PRODUCTS) IMPLANT
APPLIER CLIP 5 13 M/L LIGAMAX5 (MISCELLANEOUS) ×4
BAG DECANTER FOR FLEXI CONT (MISCELLANEOUS) IMPLANT
BLADE SURG 11 STRL SS (BLADE) ×8 IMPLANT
BRUSH CYTOL CELLEBRITY 1.5X140 (MISCELLANEOUS) IMPLANT
CANISTER SUCTION 2500CC (MISCELLANEOUS) ×12 IMPLANT
CATH KIT ON Q 5IN SLV (PAIN MANAGEMENT) ×4 IMPLANT
CATH THORACIC 28FR (CATHETERS) IMPLANT
CATH THORACIC 36FR (CATHETERS) IMPLANT
CATH THORACIC 36FR RT ANG (CATHETERS) IMPLANT
CLIP APPLIE 5 13 M/L LIGAMAX5 (MISCELLANEOUS) ×2 IMPLANT
CONN Y 3/8X3/8X3/8  BEN (MISCELLANEOUS)
CONN Y 3/8X3/8X3/8 BEN (MISCELLANEOUS) IMPLANT
CONT SPEC 4OZ CLIKSEAL STRL BL (MISCELLANEOUS) ×12 IMPLANT
COTTONBALL LRG STERILE PKG (GAUZE/BANDAGES/DRESSINGS) IMPLANT
COVER DOME SNAP 22 D (MISCELLANEOUS) ×12 IMPLANT
COVER SURGICAL LIGHT HANDLE (MISCELLANEOUS) ×4 IMPLANT
COVER TABLE BACK 60X90 (DRAPES) ×4 IMPLANT
DERMABOND ADVANCED (GAUZE/BANDAGES/DRESSINGS)
DERMABOND ADVANCED .7 DNX12 (GAUZE/BANDAGES/DRESSINGS) IMPLANT
DRAPE LAPAROSCOPIC ABDOMINAL (DRAPES) ×4 IMPLANT
DRAPE WARM FLUID 44X44 (DRAPE) IMPLANT
ELECT BLADE 4.0 EZ CLEAN MEGAD (MISCELLANEOUS) ×4
ELECT BLADE 6.5 EXT (BLADE) ×4 IMPLANT
ELECT REM PT RETURN 9FT ADLT (ELECTROSURGICAL) ×4
ELECTRODE BLDE 4.0 EZ CLN MEGD (MISCELLANEOUS) ×2 IMPLANT
ELECTRODE REM PT RTRN 9FT ADLT (ELECTROSURGICAL) ×2 IMPLANT
FELT TEFLON 1X6 (MISCELLANEOUS) ×8 IMPLANT
FILTER STRAW FLUID ASPIR (MISCELLANEOUS) ×4 IMPLANT
FORCEPS BIOP RJ4 1.8 (CUTTING FORCEPS) IMPLANT
GAUZE SPONGE 4X4 12PLY STRL (GAUZE/BANDAGES/DRESSINGS) ×4 IMPLANT
GLOVE BIO SURGEON STRL SZ7.5 (GLOVE) ×12 IMPLANT
GLOVE EUDERMIC 7 POWDERFREE (GLOVE) ×4 IMPLANT
GOWN STRL REUS W/ TWL LRG LVL3 (GOWN DISPOSABLE) ×10 IMPLANT
GOWN STRL REUS W/ TWL XL LVL3 (GOWN DISPOSABLE) ×2 IMPLANT
GOWN STRL REUS W/TWL LRG LVL3 (GOWN DISPOSABLE) ×10
GOWN STRL REUS W/TWL XL LVL3 (GOWN DISPOSABLE) ×2
HANDLE STAPLE ENDO GIA SHORT (STAPLE)
HEMOSTAT SURGICEL 2X14 (HEMOSTASIS) IMPLANT
KIT BASIN OR (CUSTOM PROCEDURE TRAY) ×4 IMPLANT
KIT CLEAN ENDO COMPLIANCE (KITS) ×8 IMPLANT
KIT ROOM TURNOVER OR (KITS) ×4 IMPLANT
KIT SUCTION CATH 14FR (SUCTIONS) ×4 IMPLANT
MARKER SKIN DUAL TIP RULER LAB (MISCELLANEOUS) ×4 IMPLANT
NEEDLE 18GX1X1/2 (RX/OR ONLY) (NEEDLE) ×4 IMPLANT
NEEDLE 22X1 1/2 (OR ONLY) (NEEDLE) ×4 IMPLANT
NEEDLE BIOPSY TRANSBRONCH 21G (NEEDLE) IMPLANT
NEEDLE BLUNT 18X1 FOR OR ONLY (NEEDLE) IMPLANT
NEEDLE EBUS SONO TIP PENTAX (NEEDLE) ×8 IMPLANT
NS IRRIG 1000ML POUR BTL (IV SOLUTION) ×12 IMPLANT
OIL SILICONE PENTAX (PARTS (SERVICE/REPAIRS)) ×4 IMPLANT
PACK CHEST (CUSTOM PROCEDURE TRAY) ×4 IMPLANT
PAD ARMBOARD 7.5X6 YLW CONV (MISCELLANEOUS) ×16 IMPLANT
RELOAD GOLD ECHELON 45 (STAPLE) ×8 IMPLANT
SEALANT PATCH FIBRIN 2X4IN (MISCELLANEOUS) ×4 IMPLANT
SEALANT PROGEL (MISCELLANEOUS) IMPLANT
SEALANT SURG COSEAL 4ML (VASCULAR PRODUCTS) IMPLANT
SEALANT SURG COSEAL 8ML (VASCULAR PRODUCTS) IMPLANT
SOLUTION ANTI FOG 6CC (MISCELLANEOUS) ×12 IMPLANT
SPONGE GAUZE 4X4 12PLY STER LF (GAUZE/BANDAGES/DRESSINGS) ×4 IMPLANT
SPONGE INTESTINAL PEANUT (DISPOSABLE) ×16 IMPLANT
SPONGE TONSIL 1.25 RF SGL STRG (GAUZE/BANDAGES/DRESSINGS) ×16 IMPLANT
STAPLE RELOAD 2.5MM WHITE (STAPLE) ×8 IMPLANT
STAPLER ECHELON POWERED (MISCELLANEOUS) ×4 IMPLANT
STAPLER ENDO GIA 12MM SHORT (STAPLE) IMPLANT
STAPLER TA30 4.8 NON-ABS (STAPLE) IMPLANT
STAPLER VASCULAR ECHELON 35 (CUTTER) ×4 IMPLANT
SUT CHROMIC 3 0 SH 27 (SUTURE) IMPLANT
SUT ETHILON 3 0 PS 1 (SUTURE) IMPLANT
SUT PROLENE 3 0 SH DA (SUTURE) IMPLANT
SUT PROLENE 4 0 RB 1 (SUTURE) ×18
SUT PROLENE 4 0 SH DA (SUTURE) ×44 IMPLANT
SUT PROLENE 4-0 RB1 .5 CRCL 36 (SUTURE) ×18 IMPLANT
SUT PROLENE 6 0 C 1 30 (SUTURE) IMPLANT
SUT SILK  1 MH (SUTURE) ×4
SUT SILK 1 MH (SUTURE) ×4 IMPLANT
SUT SILK 1 TIES 10X30 (SUTURE) IMPLANT
SUT SILK 2 0SH CR/8 30 (SUTURE) IMPLANT
SUT SILK 3 0 SH CR/8 (SUTURE) ×4 IMPLANT
SUT SILK 3 0SH CR/8 30 (SUTURE) IMPLANT
SUT VIC AB 1 CTX 18 (SUTURE) ×4 IMPLANT
SUT VIC AB 2 TP1 27 (SUTURE) IMPLANT
SUT VIC AB 2-0 CTX 36 (SUTURE) ×4 IMPLANT
SUT VIC AB 3-0 SH 18 (SUTURE) IMPLANT
SUT VIC AB 3-0 X1 27 (SUTURE) ×8 IMPLANT
SUT VICRYL 0 UR6 27IN ABS (SUTURE) IMPLANT
SUT VICRYL 2 TP 1 (SUTURE) ×4 IMPLANT
SWAB COLLECTION DEVICE MRSA (MISCELLANEOUS) IMPLANT
SYR 20CC LL (SYRINGE) ×4 IMPLANT
SYR 20ML ECCENTRIC (SYRINGE) ×8 IMPLANT
SYR 5ML LUER SLIP (SYRINGE) ×4 IMPLANT
SYR CONTROL 10ML LL (SYRINGE) ×8 IMPLANT
SYRINGE 10CC LL (SYRINGE) ×4 IMPLANT
SYSTEM SAHARA CHEST DRAIN ATS (WOUND CARE) ×4 IMPLANT
TAPE CLOTH SURG 4X10 WHT LF (GAUZE/BANDAGES/DRESSINGS) ×4 IMPLANT
TIP APPLICATOR SPRAY EXTEND 16 (VASCULAR PRODUCTS) IMPLANT
TOWEL OR 17X24 6PK STRL BLUE (TOWEL DISPOSABLE) ×8 IMPLANT
TOWEL OR 17X26 10 PK STRL BLUE (TOWEL DISPOSABLE) ×4 IMPLANT
TRAP SPECIMEN MUCOUS 40CC (MISCELLANEOUS) ×8 IMPLANT
TRAY FOLEY CATH 16FRSI W/METER (SET/KITS/TRAYS/PACK) ×4 IMPLANT
TUBE ANAEROBIC SPECIMEN COL (MISCELLANEOUS) IMPLANT
TUBE CONNECTING 20'X1/4 (TUBING) ×2
TUBE CONNECTING 20X1/4 (TUBING) ×6 IMPLANT
TUNNELER SHEATH ON-Q 11GX8 DSP (PAIN MANAGEMENT) ×4 IMPLANT
WATER STERILE IRR 1000ML POUR (IV SOLUTION) ×4 IMPLANT
YANKAUER SUCT BULB TIP NO VENT (SUCTIONS) ×4 IMPLANT

## 2016-05-27 LAB — PREPARE FRESH FROZEN PLASMA
Unit division: 0
Unit division: 0
Unit division: 0
Unit division: 0
Unit division: 0
Unit division: 0
Unit division: 0
Unit division: 0
Unit division: 0
Unit division: 0
Unit division: 0
Unit division: 0
Unit division: 0
Unit division: 0
Unit division: 0
Unit division: 0
Unit division: 0
Unit division: 0
Unit division: 0
Unit division: 0

## 2016-05-27 LAB — PREPARE PLATELET PHERESIS
Unit division: 0
Unit division: 0

## 2016-05-27 LAB — PREPARE CRYOPRECIPITATE
Unit division: 0
Unit division: 0

## 2016-05-28 LAB — TYPE AND SCREEN
ABO/RH(D): O POS
Antibody Screen: NEGATIVE
Unit division: 0
Unit division: 0
Unit division: 0
Unit division: 0
Unit division: 0
Unit division: 0
Unit division: 0
Unit division: 0
Unit division: 0
Unit division: 0
Unit division: 0
Unit division: 0
Unit division: 0
Unit division: 0
Unit division: 0
Unit division: 0
Unit division: 0
Unit division: 0
Unit division: 0
Unit division: 0
Unit division: 0
Unit division: 0
Unit division: 0
Unit division: 0

## 2016-05-28 LAB — CULTURE, RESPIRATORY W GRAM STAIN: Culture: NO GROWTH

## 2016-06-02 ENCOUNTER — Encounter (HOSPITAL_COMMUNITY): Payer: Self-pay | Admitting: Cardiothoracic Surgery

## 2016-06-17 NOTE — Progress Notes (Signed)
Responded to page from O R nurse to meet Dr. Lawson Fiscal and accompany him to speak with friend of patient. Patient passed in O.R.. I remained with Dr.Van Tright while he briefed patients' friend Benjamine Mola). I provided prayer, grief, emotional and spiritual support to both patients friend and to staff. I help  friend with contacting daughterClaiborne Billings) and other family members. Upon arrival of daughter I accompanied Dr. Lucianne Lei Tright back to consult room as he shared with her about mother passing. I continued support, escorted daughter and others to visit with the deceased. I facilitated information sharing between family and staff.  Daughter did not know funeral home information. I gave her patient placement card with instruction on what to do and what was to happen next. I remained with family until their departure.   05-30-2016 1500  Clinical Encounter Type  Visited With Patient;Family;Patient and family together;Health care provider  Visit Type Spiritual support;Post-op;Death  Referral From Nurse  Spiritual Encounters  Spiritual Needs Prayer;Emotional;Grief support  Stress Factors  Family Stress Factors Exhausted;Loss  Karma Lew, pager [phone]

## 2016-06-17 NOTE — Anesthesia Procedure Notes (Signed)
Anesthesia Procedure Image      R IJ vein with needle for CVC catheter

## 2016-06-17 NOTE — Progress Notes (Signed)
The patient was examined and preop studies reviewed. There has been no change from the prior exam and the patient is ready for surgery.  plan bronchoscopy, EBUS Right VATs - lobectomy on M Denson

## 2016-06-17 NOTE — Anesthesia Postprocedure Evaluation (Signed)
Anesthesia Post Note  Patient: Rose Dunn  Procedure(s) Performed: Procedure(s) (LRB): VIDEO BRONCHOSCOPY WITH ENDOBRONCHIAL ULTRASOUND (N/A) VIDEO ASSISTED THORACOSCOPY (VATS)/ LOBECTOMY (Right)  Patient location during evaluation: Other Anesthesia Type: General Level of consciousness: patient remains intubated per anesthesia plan Vital Signs Assessment: vitals unstable Respiratory status: respiratory function unstable Cardiovascular status: unstable Anesthetic complications: yes (see intraop record, patient expired during surgery after prolonged ACLS protocol)    Last Vitals:  Vitals:   06/11/2016 0621  BP: (!) 140/55  Pulse: 82  Resp: 16  Temp: 36.8 C    Last Pain:  Vitals:   2016-06-11 [id number]  TempSrc: Oral                 Zenaida Deed

## 2016-06-17 NOTE — Op Note (Signed)
NAME:  Rose Dunn, AMIEL NO.:  [id number]  MEDICAL RECORD NO.:  [id number]  LOCATION:  [facility]                         FACILITY:  [facility]  PHYSICIAN:  Ivin Poot, M.D.  DATE OF BIRTH:  1940/08/28  DATE OF PROCEDURE:  06-17-16 DATE OF DISCHARGE:  2016/06/17                              OPERATIVE REPORT   OPERATION: 1. Video bronchoscopy. 2. Endoscopic transbronchial ultrasound-guided biopsy of mediastinal     lymph node, 4R. 3. Right video-assisted thoracoscopic surgery and thoracotomy for     attempted right upper lobectomy.  SURGEON:  Ivin Poot, M.D.  ASSISTANT:  Encarnacion Chu, PA-C, John Giovanni, P.A.-C and Gilford Raid, M.D.  ANESTHESIA:  General by Dr. [place] Pink.  PREOPERATIVE DIAGNOSIS:  Right upper lobe tumor with atelectasis of the anterior apical segment of the right upper lobe, bronchoscopic findings consistent with bronchial adenoma.  POSTOPERATIVE DIAGNOSIS:  Right upper lobe tumor with atelectasis of the anterior apical segment of the right upper lobe, bronchoscopic findings consistent with bronchial adenoma.  CLINICAL NOTE:  The patient is a 75 year old Caucasian female, reformed smoker, who presents with history of upper respiratory infections, shortness of breath and a right upper lobe tumor diagnosed by her pulmonologist in [facility] earlier in the summer.  CT scan and PET scan demonstrated the right upper lobe tumor with mild hypermetabolic activity on PET scan, SUV approximately 4.2.  The PET scan also showed a 1.2-cm right paratracheal level 4R lymph node with mild hypermetabolic activity on PET scan, measuring 3.2.  Bronchoscopy and biopsy by the pulmonologist in [facility] were nondiagnostic for the tumor.  However, the appearance of the tumor morphologically on the scans and the description of the tumor on bronchoscopy report were consistent with bronchial adenoma.  CAT scan showed no evidence of other metastatic disease.  I  discussed the procedure of lobectomy to remove this tumor. The patient was seen preoperatively by her medical oncologist, Dr. Julien Nordmann, who also agreed with the plan to proceed with surgical resection.  Because of the 4R lymph node with mild metabolic activity, it was recommended that a transbronchial biopsy be performed of the lymph node to make sure this was not a malignancy, which would stage the primary to stage III.  The patient was originally scheduled for surgery earlier in the summer, but the surgery was rescheduled by the Anesthesia team, who felt that the patient needs the cardiac clearance.  The patient was subsequently evaluated by Cardiology and an echo and myocardial perfusion scan were performed and the patient was felt to be a satisfactory candidate for lobectomy from cardiac standpoint.  I did my final evaluation of the patient in the office the day before surgery, again reviewing the operative plan to perform bronchoscopy, transbronchial ultrasound biopsy and then right upper lobectomy to remove the tumor.  I discussed the use of general anesthesia, the location of surgical incision, and the expected postoperative hospital recovery.  I discussed with the patient the plan to perform a lobectomy as a lesser resection would not provide certain resection of the tumor mass.  The patient's preoperative pulmonary function testing was excellent and I explained to the patient that resection of the  right upper lobe would have minimal if any effect on her exercise tolerance after recovered from the surgery.  I also discussed the potential risks of the procedure to the patient including a risks of bleeding, blood transfusion requirement, postoperative pneumonia, requiring ventilator support, persistent air leak, requiring prolonged chest tube management, postoperative cardiac problems including arrhythmias and the possibility of perioperative stroke and operative mortality in the  4-5% range. After reviewing these issues today, the patient demonstrated her understanding and agreed to proceed with surgery under what I felt was an informed consent.  OPERATIVE PROCEDURE:  The patient was brought to the operating room and placed supine on the operating table where general anesthesia was induced.  Preoperatively, the patient had been evaluated in the preop holding and was marked on the proper site and informed consent, and history and physical uptake was performed.  A proper time-out was performed.  Through the endotracheal tube, a fiberoptic bronchoscope was passed.  The bronchoscope was passed to the distal trachea and carina. There were no abnormal findings.  The bronchoscope was passed on the left mainstem bronchus and the endobronchial segments of the upper lobe and lower lobe were examined.  There were no endobronchial lesions.  The bronchoscope was then passed down the right mainstem bronchus.  This was normal.  The bronchoscope was passed in the right upper lobe bronchus. The first segment was normal.  The second segment had a round, smooth endobronchial mass with almost total occlusion of the airway.  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  Washings were obtained and the bronchoscope was removed. Washings were sent for both culture and cytology.  The bronchoscope was then exchanged for the endobronchial ultrasound scope.  The endobronchial ultrasound scope was passed to the distal trachea and carina.  In the proximal portion of the right mainstem bronchus, the peritracheal lymph node in the 4R station was visualized and several transbronchial biopsies were performed without significant bleeding.  The airway was irrigated and the bronchoscope was withdrawn.  The patient was then prepared for right VATS.  The double-lumen endotracheal tube  was exchanged for the single-lumen tube by the Anesthesia team.  The patient was turned and rolled right side up.  The patient was then prepped and draped as a sterile field.  A proper time- out was again performed.  A small incision for the thorascopic camera was made.  The pathology report on the quick prep for the transbronchial aspirate was negative for malignancy.  The camera was inserted.  There was poor collapse of the right lung.  The Anesthesia team worked on improving right lung collapse to improve visibility.  There was still significant aeration of the lung.  We converted to an open procedure.  A small incision was made in the fifth interspace anterior to the scapula. The incision was extended slightly.  The retractors were placed.  The ribs were gently spread, but not broken.  The lung was inspected and palpated.  The anterior  segment of the right upper lobe was chronically atelectatic from the tumor.  The tumor was palpable.  There were no enlarged hilar nodes.  We then proceeded with dissection of the hilar vessels.  The mediastinal pleura was opened under the azygos vein and extending anteriorly to the superior vena cava.  The anterior pulmonary vein to the upper lobe was identified and encircled with  vessel loops and dissected from the pulmonary artery, which was posterior.  The superior pulmonary vein was then stapled and then divided.  This exposed the pulmonary artery more posteriorly.  It was also deep to the superior vena cava.  The more anterior branch of the right upper lobe was dissected out and encircled with vessel loop, and stapled and divided with the thoracoscopic stapler.  This exposed deeper branch to the upper lobe.  This vessel was then carefully dissected and encircled with a loop.  The stapler was then used to staple and divide the more posterior branch of the pulmonary artery.  The stapler was deployed in the usual fashion.  When the stapler was  removed, it was apparent, the pulmonary artery branch was cleanly divided, but there was no staple line.  There was immediate bleeding from the proximal pulmonary artery and this was controlled with direct pressure.  The Anesthesia team was alerted for the need for blood, which had already been brought to the room and blood transfusion was started.  The patient remained stable.  At this time, I also asked for assistance from one of my cardiothoracic surgical partner, Dr. Cyndia Bent, who then came immediately and provided assistance.  Attempts were then made to stop the bleeding from the divided, but non- stapled branch of the pulmonary artery.  Further dissection proximally to dissect the vessel coming underneath the superior vena cava was undertaken.  Exposure was difficult because the patient's short, obese body habitus and the vessels were very deep in the chest.  Direct closure of the branch with sutures was not possible due to the deep location and then our surgical strategy was to place vascular clamps proximal and distal to the area of the bleeding and then repair that with sutures.  This was performed.  The pledgeted 4-0 Prolene sutures were placed.  There was hemodynamic instability from bleeding, which was Treated by Anesthesia.  The vascular clamps were then removed.The bleeding site was controlled by the pledgeted suture. It was then apparent that there was some bleeding in the proximal pulmonary artery deep to the vena cava and bleeding from this was more severe.  The patient had been resuscitated successfully with the bleeding and subsequent control of the first portion of the pulmonary artery, which was divided, but not stapled.  Bleeding from the more proximal portion of the pulmonary artery was more difficult to expose and clamps were applied and attempts at direct suture ligation were made with pledgeted sutures.  This resulted with severe bleeding, further  hemodynamic deterioration, cardiac arrhythmias.  Despite maximal attempts with internal massage, intracardiac epinephrine, maximal resuscitation by the Anesthesia team,the patient continued to show low blood pressure, absent pulse and after the resuscitation had continued for an extended period of time, we decided to stop out efforts.  The bleeding had been controlled, but cardiac function absent and was asystolic without vital signs.  The patient was declared dead at 11:45 a.m.     Ivin Poot, M.D.     PV/MEDQ  D:  2016-06-13  T:  05/27/2016  Job:  [id number]

## 2016-06-17 NOTE — Transfer of Care (Signed)
Immediate Anesthesia Transfer of Care Note  Patient: Rose Dunn  Procedure(s) Performed: Procedure(s): VIDEO BRONCHOSCOPY WITH ENDOBRONCHIAL ULTRASOUND (N/A) VIDEO ASSISTED THORACOSCOPY (VATS)/ LOBECTOMY (Right)  Pt expired in the OR. Moved to isolation room for family to view body.

## 2016-06-17 NOTE — Discharge Summary (Signed)
NAME:  Rose Dunn, Rose Dunn NO.:  [id number]  MEDICAL RECORD NO.:  [id number]  LOCATION:  [facility]                         FACILITY:  [facility]  PHYSICIAN:  Ivin Poot, M.D.  DATE OF BIRTH:  16-Jul-1941  DATE OF ADMISSION:  06-18-2016 DATE OF DISCHARGE:  18-Jun-2016                              DISCHARGE SUMMARY   ADMISSION DIAGNOSES: 1. 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 2. History of chronic systolic and diastolic heart failure. 3. Hypertension. 4. Hyperlipidemia. 5. Hypothyroidism. 6. Depression-anxiety.  OPERATIONS AND PROCEDURES: 1. Bronchoscopy with endobronchial ultrasound guided transbronchial     biopsy of mediastinal node. 2. Right VATS-thoracotomy for attempted right upper lobectomy.  HOSPITAL COURSE:  The patient is a 75 year old Caucasian female reformed smoker, who presented to the hospital for right VATS and right upper lobectomy.  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  She had undergone Cardiology evaluation prior to the surgery for a low ejection fraction by echocardiogram of approximately 35 -40%.  A subsequent Myoview scan showed evidence of a myocardial scar, but without evidence of coronary malperfusion and she was felt to be cleared for surgery by her Cardiology consultant.  OPERATIVE PROCEDURE:  The patient was brought to the operating room and underwent anesthesia without difficulty.  The bronchoscopy and bronchoscopic biopsies were performed without difficulty.  The patient was then turned and prepped for the lobectomy after the double-lumen endotracheal tube has been passed.  The transbronchial biopsy of the mediastinal node had been called and was negative.  The patient underwent right  VATS-thoracotomy.  During the procedure, the hilar vessels were ligated and divided in the standard fashion using the stapling device. The third vessel approached for ligation and division  was divided by the stapling device, but not ligated.  This led to significant bleeding from the proximal pulmonary artery.  The site of initial bleeding was controlled with direct suture repair.  In the process of obtaining proximal control with a vascular clamp, the proximal pulmonary artery under the vena cava tore and further bleeding resulted, which was very difficult to repair.  Despite maximal attempts at resuscitation by the Anesthesia team and maximal surgical attempts to control bleeding.  The patient expired in the operating room.  FINAL DIAGNOSES: 1. 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 2. History of hypertension. 3. Gastroesophageal reflux disease. 4. Reduction in left ventricular ejection fraction 40%. 5. Hypothyroidism. 6. Osteoarthritis. 7. Anxiety-depression.     Ivin Poot, M.D.     PV/MEDQ  D:  06/06/2016  T:  06/06/2016  Job:  [id number]

## 2016-06-17 NOTE — Anesthesia Procedure Notes (Signed)
Procedure Name: Intubation Date/Time: 2016/06/16 7:47 AM Performed by: Kyung Rudd Pre-anesthesia Checklist: Patient identified, Emergency Drugs available, Suction available and Patient being monitored Patient Re-evaluated:Patient Re-evaluated prior to inductionOxygen Delivery Method: Circle system utilized Preoxygenation: Pre-oxygenation with 100% oxygen Intubation Type: IV induction Ventilation: Mask ventilation without difficulty Laryngoscope Size: Mac and 4 Grade View: Grade I Tube type: Oral Tube size: 8.5 mm Number of attempts: 1 Airway Equipment and Method: Stylet Placement Confirmation: ETT inserted through vocal cords under direct vision,  positive ETCO2 and breath sounds checked- equal and bilateral Secured at: 20 cm Tube secured with: Tape Dental Injury: Teeth and Oropharynx as per pre-operative assessment

## 2016-06-17 NOTE — Anesthesia Preprocedure Evaluation (Addendum)
Anesthesia Evaluation  Patient identified by MRN, date of birth, ID band Patient awake    Reviewed: Allergy & Precautions, H&P , NPO status , Patient's Chart, lab work & pertinent test results  History of Anesthesia Complications Negative for: history of anesthetic complications  Airway Mallampati: II  TM Distance: >3 FB Neck ROM: full    Dental no notable dental hx. (+) Teeth Intact, Dental Advisory Given   Pulmonary neg pulmonary ROS, former smoker,    Pulmonary exam normal breath sounds clear to auscultation       Cardiovascular hypertension, + Past MI  Normal cardiovascular exam Rhythm:regular Rate:Normal     Neuro/Psych  Headaches, PSYCHIATRIC DISORDERS Anxiety Depression    GI/Hepatic Neg liver ROS, GERD  ,  Endo/Other  Hypothyroidism   Renal/GU negative Renal ROS     Musculoskeletal   Abdominal   Peds  (+) premature delivery Hematology negative hematology ROS (+)   Anesthesia Other Findings   Reproductive/Obstetrics negative OB ROS                           Anesthesia Physical Anesthesia Plan  ASA: III  Anesthesia Plan: General   Post-op Pain Management:    Induction: Intravenous  Airway Management Planned: Double Lumen EBT  Additional Equipment: Arterial line, CVP and Ultrasound Guidance Line Placement  Intra-op Plan:   Post-operative Plan: Possible Post-op intubation/ventilation  Informed Consent: I have reviewed the patients History and Physical, chart, labs and discussed the procedure including the risks, benefits and alternatives for the proposed anesthesia with the patient or authorized representative who has indicated his/her understanding and acceptance.   Dental Advisory Given and Dental advisory given  Plan Discussed with: Anesthesiologist, CRNA and Surgeon  Anesthesia Plan Comments:        Anesthesia Quick Evaluation

## 2016-06-17 DEATH — deceased

## 2016-10-11 IMAGING — NM NM MISC PROCEDURE
6 series · 36 of 36 positions shown · non-contrast
Comparison: none

[Series 1: wbr rest · 6.40mm/px · 6 of 63 frames shown]
[frame 6/63]
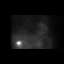
[frame 16/63]
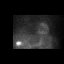
[frame 27/63]
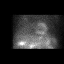
[frame 37/63]
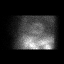
[frame 48/63]
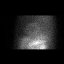
[frame 58/63]
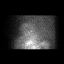

[Series 1: wbr_r-proj_st wbr rest · 6.40mm/px · 6 of 64 frames shown]
[frame 6/64]
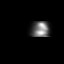
[frame 16/64]
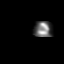
[frame 27/64]
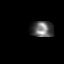
[frame 38/64]
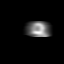
[frame 48/64]
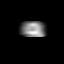
[frame 59/64]
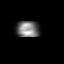

[Series 2: wbr stress-gsp · 6.40mm/px · 6 of 507 frames shown]
[frame 43/507]
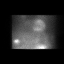
[frame 127/507]
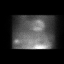
[frame 212/507]
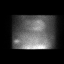
[frame 296/507]
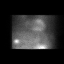
[frame 381/507]
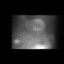
[frame 465/507]
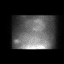

[Series 2: wbr_s-proj_st wbr stress-gsp · 6.40mm/px · 6 of 512 frames shown]
[frame 43/512]
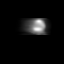
[frame 128/512]
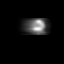
[frame 214/512]
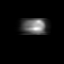
[frame 299/512]
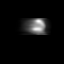
[frame 384/512]
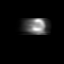
[frame 470/512]
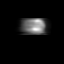

[Series 3: wbr stress-sum-em · 6.40mm/px · 6 of 64 frames shown]
[frame 6/64]
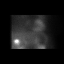
[frame 16/64]
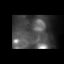
[frame 27/64]
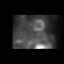
[frame 38/64]
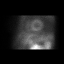
[frame 48/64]
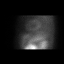
[frame 59/64]
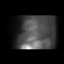

[Series 3: wbr_s-proj_st wbr stress-sum-em · 6.40mm/px · 6 of 64 frames shown]
[frame 6/64]
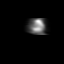
[frame 16/64]
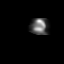
[frame 27/64]
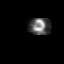
[frame 38/64]
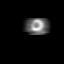
[frame 48/64]
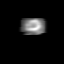
[frame 59/64]
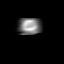

[36 of 36 positions shown; findings below may reference images not displayed]

Canned report from images found in remote index.

Refer to host system for actual result text.
